# Patient Record
Sex: Female | Born: 1935 | Race: White | Hispanic: No | Smoking: Former smoker
Health system: Southern US, Community
[De-identification: ages and names within clinical notes are randomized; demographics above are authoritative.]

## PROBLEM LIST (undated history)

## (undated) DIAGNOSIS — J984 Other disorders of lung: Secondary | ICD-10-CM

## (undated) DIAGNOSIS — L258 Unspecified contact dermatitis due to other agents: Secondary | ICD-10-CM

## (undated) DIAGNOSIS — E119 Type 2 diabetes mellitus without complications: Secondary | ICD-10-CM

## (undated) DIAGNOSIS — J309 Allergic rhinitis, unspecified: Secondary | ICD-10-CM

## (undated) DIAGNOSIS — N259 Disorder resulting from impaired renal tubular function, unspecified: Secondary | ICD-10-CM

## (undated) DIAGNOSIS — N952 Postmenopausal atrophic vaginitis: Secondary | ICD-10-CM

## (undated) DIAGNOSIS — I872 Venous insufficiency (chronic) (peripheral): Secondary | ICD-10-CM

## (undated) DIAGNOSIS — I10 Essential (primary) hypertension: Secondary | ICD-10-CM

## (undated) DIAGNOSIS — N390 Urinary tract infection, site not specified: Secondary | ICD-10-CM

## (undated) DIAGNOSIS — R609 Edema, unspecified: Secondary | ICD-10-CM

## (undated) DIAGNOSIS — E785 Hyperlipidemia, unspecified: Secondary | ICD-10-CM

## (undated) DIAGNOSIS — B029 Zoster without complications: Secondary | ICD-10-CM

## (undated) DIAGNOSIS — N63 Unspecified lump in unspecified breast: Secondary | ICD-10-CM

## (undated) HISTORY — DX: Hyperlipidemia, unspecified: E78.5

## (undated) HISTORY — DX: Postmenopausal atrophic vaginitis: N95.2

## (undated) HISTORY — DX: Zoster without complications: B02.9

## (undated) HISTORY — DX: Essential (primary) hypertension: I10

## (undated) HISTORY — DX: Edema, unspecified: R60.9

## (undated) HISTORY — DX: Other disorders of lung: J98.4

## (undated) HISTORY — DX: Type 2 diabetes mellitus without complications: E11.9

## (undated) HISTORY — PX: EYE SURGERY: SHX253

## (undated) HISTORY — DX: Allergic rhinitis, unspecified: J30.9

## (undated) HISTORY — DX: Urinary tract infection, site not specified: N39.0

## (undated) HISTORY — DX: Unspecified contact dermatitis due to other agents: L25.8

## (undated) HISTORY — PX: TOOTH EXTRACTION: SUR596

## (undated) HISTORY — DX: Venous insufficiency (chronic) (peripheral): I87.2

## (undated) HISTORY — DX: Disorder resulting from impaired renal tubular function, unspecified: N25.9

## (undated) HISTORY — PX: HEMORRHOID SURGERY: SHX153

## (undated) HISTORY — DX: Unspecified lump in unspecified breast: N63.0

---

## 1999-12-27 ENCOUNTER — Encounter: Admission: RE | Admit: 1999-12-27 | Discharge: 1999-12-27 | Payer: Self-pay | Admitting: Family Medicine

## 1999-12-27 ENCOUNTER — Encounter: Payer: Self-pay | Admitting: Family Medicine

## 2000-12-04 ENCOUNTER — Other Ambulatory Visit: Admission: RE | Admit: 2000-12-04 | Discharge: 2000-12-04 | Payer: Self-pay | Admitting: Family Medicine

## 2000-12-27 ENCOUNTER — Encounter: Payer: Self-pay | Admitting: Family Medicine

## 2000-12-27 ENCOUNTER — Encounter: Admission: RE | Admit: 2000-12-27 | Discharge: 2000-12-27 | Payer: Self-pay | Admitting: Family Medicine

## 2002-01-10 ENCOUNTER — Encounter: Admission: RE | Admit: 2002-01-10 | Discharge: 2002-01-10 | Payer: Self-pay | Admitting: Family Medicine

## 2002-01-10 ENCOUNTER — Encounter: Payer: Self-pay | Admitting: Family Medicine

## 2002-02-20 ENCOUNTER — Other Ambulatory Visit: Admission: RE | Admit: 2002-02-20 | Discharge: 2002-02-20 | Payer: Self-pay | Admitting: Family Medicine

## 2003-02-12 ENCOUNTER — Encounter: Payer: Self-pay | Admitting: Family Medicine

## 2003-02-12 ENCOUNTER — Encounter: Admission: RE | Admit: 2003-02-12 | Discharge: 2003-02-12 | Payer: Self-pay | Admitting: Family Medicine

## 2003-02-19 ENCOUNTER — Encounter: Admission: RE | Admit: 2003-02-19 | Discharge: 2003-02-19 | Payer: Self-pay | Admitting: Family Medicine

## 2003-02-19 ENCOUNTER — Encounter: Payer: Self-pay | Admitting: Family Medicine

## 2003-03-27 ENCOUNTER — Other Ambulatory Visit: Admission: RE | Admit: 2003-03-27 | Discharge: 2003-03-27 | Payer: Self-pay | Admitting: Family Medicine

## 2003-04-15 ENCOUNTER — Encounter: Admission: RE | Admit: 2003-04-15 | Discharge: 2003-04-15 | Payer: Self-pay | Admitting: Family Medicine

## 2003-04-15 ENCOUNTER — Encounter: Payer: Self-pay | Admitting: Family Medicine

## 2004-03-24 ENCOUNTER — Other Ambulatory Visit: Admission: RE | Admit: 2004-03-24 | Discharge: 2004-03-24 | Payer: Self-pay | Admitting: Family Medicine

## 2004-03-25 ENCOUNTER — Encounter: Admission: RE | Admit: 2004-03-25 | Discharge: 2004-03-25 | Payer: Self-pay | Admitting: Family Medicine

## 2004-04-12 ENCOUNTER — Encounter: Admission: RE | Admit: 2004-04-12 | Discharge: 2004-04-12 | Payer: Self-pay | Admitting: Family Medicine

## 2005-04-03 ENCOUNTER — Other Ambulatory Visit: Admission: RE | Admit: 2005-04-03 | Discharge: 2005-04-03 | Payer: Self-pay | Admitting: Family Medicine

## 2005-04-03 ENCOUNTER — Ambulatory Visit: Payer: Self-pay | Admitting: Family Medicine

## 2006-04-20 ENCOUNTER — Encounter: Admission: RE | Admit: 2006-04-20 | Discharge: 2006-04-20 | Payer: Self-pay | Admitting: Family Medicine

## 2006-06-15 ENCOUNTER — Ambulatory Visit: Payer: Self-pay | Admitting: Family Medicine

## 2006-06-15 ENCOUNTER — Encounter: Admission: RE | Admit: 2006-06-15 | Discharge: 2006-06-15 | Payer: Self-pay | Admitting: Family Medicine

## 2006-06-15 ENCOUNTER — Other Ambulatory Visit: Admission: RE | Admit: 2006-06-15 | Discharge: 2006-06-15 | Payer: Self-pay | Admitting: Family Medicine

## 2006-06-15 ENCOUNTER — Encounter: Payer: Self-pay | Admitting: Family Medicine

## 2006-06-18 ENCOUNTER — Ambulatory Visit: Payer: Self-pay | Admitting: Internal Medicine

## 2007-04-22 ENCOUNTER — Encounter: Admission: RE | Admit: 2007-04-22 | Discharge: 2007-04-22 | Payer: Self-pay | Admitting: Family Medicine

## 2007-06-05 DIAGNOSIS — N259 Disorder resulting from impaired renal tubular function, unspecified: Secondary | ICD-10-CM

## 2007-06-05 DIAGNOSIS — E785 Hyperlipidemia, unspecified: Secondary | ICD-10-CM | POA: Insufficient documentation

## 2007-06-05 DIAGNOSIS — J309 Allergic rhinitis, unspecified: Secondary | ICD-10-CM | POA: Insufficient documentation

## 2007-06-05 HISTORY — DX: Allergic rhinitis, unspecified: J30.9

## 2007-06-05 HISTORY — DX: Hyperlipidemia, unspecified: E78.5

## 2007-06-05 HISTORY — DX: Disorder resulting from impaired renal tubular function, unspecified: N25.9

## 2007-06-26 ENCOUNTER — Other Ambulatory Visit: Admission: RE | Admit: 2007-06-26 | Discharge: 2007-06-26 | Payer: Self-pay | Admitting: Family Medicine

## 2007-06-26 ENCOUNTER — Encounter: Payer: Self-pay | Admitting: Family Medicine

## 2007-06-26 ENCOUNTER — Ambulatory Visit: Payer: Self-pay | Admitting: Family Medicine

## 2007-06-26 DIAGNOSIS — R209 Unspecified disturbances of skin sensation: Secondary | ICD-10-CM | POA: Insufficient documentation

## 2007-06-26 DIAGNOSIS — N952 Postmenopausal atrophic vaginitis: Secondary | ICD-10-CM | POA: Insufficient documentation

## 2007-06-26 HISTORY — DX: Postmenopausal atrophic vaginitis: N95.2

## 2007-07-22 ENCOUNTER — Ambulatory Visit: Payer: Self-pay | Admitting: Gastroenterology

## 2007-07-29 ENCOUNTER — Ambulatory Visit: Payer: Self-pay | Admitting: Gastroenterology

## 2007-07-29 ENCOUNTER — Encounter: Payer: Self-pay | Admitting: Family Medicine

## 2007-08-22 ENCOUNTER — Ambulatory Visit: Payer: Self-pay | Admitting: Family Medicine

## 2007-09-18 ENCOUNTER — Encounter: Payer: Self-pay | Admitting: Family Medicine

## 2008-05-13 ENCOUNTER — Encounter: Admission: RE | Admit: 2008-05-13 | Discharge: 2008-05-13 | Payer: Self-pay | Admitting: Family Medicine

## 2008-07-15 ENCOUNTER — Encounter: Payer: Self-pay | Admitting: Family Medicine

## 2008-07-16 ENCOUNTER — Ambulatory Visit: Payer: Self-pay | Admitting: Family Medicine

## 2008-07-16 ENCOUNTER — Encounter: Payer: Self-pay | Admitting: Family Medicine

## 2008-07-16 ENCOUNTER — Other Ambulatory Visit: Admission: RE | Admit: 2008-07-16 | Discharge: 2008-07-16 | Payer: Self-pay | Admitting: Family Medicine

## 2008-07-16 LAB — CONVERTED CEMR LAB
ALT: 20 units/L (ref 0–35)
AST: 16 units/L (ref 0–37)
Alkaline Phosphatase: 56 units/L (ref 39–117)
Chloride: 104 meq/L (ref 96–112)
Eosinophils Absolute: 0.2 10*3/uL (ref 0.0–0.7)
GFR calc non Af Amer: 66 mL/min
HDL: 44.9 mg/dL (ref >39.0)
Hemoglobin: 14.3 g/dL (ref 12.0–15.0)
Ketones, urine, test strip: NEGATIVE
MCHC: 34.6 g/dL (ref 30.0–36.0)
MCV: 92.2 fL (ref 78.0–100.0)
Monocytes Absolute: 0.7 10*3/uL (ref 0.1–1.0)
Neutro Abs: 3.8 10*3/uL (ref 1.4–7.7)
Nitrite: NEGATIVE
Potassium: 4.4 meq/L (ref 3.5–5.1)
Protein, U semiquant: NEGATIVE
RBC: 4.49 M/uL (ref 3.87–5.11)
Sodium: 142 meq/L (ref 135–145)
Total Bilirubin: 0.8 mg/dL (ref 0.3–1.2)

## 2008-09-03 ENCOUNTER — Ambulatory Visit: Payer: Self-pay | Admitting: Family Medicine

## 2008-09-03 DIAGNOSIS — N390 Urinary tract infection, site not specified: Secondary | ICD-10-CM

## 2008-09-03 HISTORY — DX: Urinary tract infection, site not specified: N39.0

## 2008-09-03 LAB — CONVERTED CEMR LAB
Nitrite: NEGATIVE
Protein, U semiquant: 30
Specific Gravity, Urine: <1.005
pH: 6.5

## 2009-07-15 ENCOUNTER — Encounter: Admission: RE | Admit: 2009-07-15 | Discharge: 2009-07-15 | Payer: Self-pay | Admitting: Family Medicine

## 2009-07-19 ENCOUNTER — Other Ambulatory Visit: Admission: RE | Admit: 2009-07-19 | Discharge: 2009-07-19 | Payer: Self-pay | Admitting: Family Medicine

## 2009-07-19 ENCOUNTER — Ambulatory Visit: Payer: Self-pay | Admitting: Family Medicine

## 2009-07-19 ENCOUNTER — Encounter: Payer: Self-pay | Admitting: Family Medicine

## 2009-07-19 DIAGNOSIS — R609 Edema, unspecified: Secondary | ICD-10-CM

## 2009-07-19 HISTORY — DX: Edema, unspecified: R60.9

## 2009-07-26 ENCOUNTER — Telehealth: Payer: Self-pay | Admitting: Family Medicine

## 2009-07-26 DIAGNOSIS — J984 Other disorders of lung: Secondary | ICD-10-CM

## 2009-07-26 HISTORY — DX: Other disorders of lung: J98.4

## 2009-07-30 ENCOUNTER — Ambulatory Visit: Payer: Self-pay | Admitting: Internal Medicine

## 2009-11-28 DIAGNOSIS — B029 Zoster without complications: Secondary | ICD-10-CM

## 2009-11-28 HISTORY — DX: Zoster without complications: B02.9

## 2009-12-01 ENCOUNTER — Ambulatory Visit: Payer: Self-pay | Admitting: Family Medicine

## 2009-12-01 ENCOUNTER — Telehealth: Payer: Self-pay | Admitting: Family Medicine

## 2009-12-09 ENCOUNTER — Ambulatory Visit: Payer: Self-pay | Admitting: Family Medicine

## 2010-01-31 ENCOUNTER — Ambulatory Visit: Payer: Self-pay | Admitting: Family Medicine

## 2010-02-02 ENCOUNTER — Ambulatory Visit: Payer: Self-pay | Admitting: Family Medicine

## 2010-02-02 DIAGNOSIS — L258 Unspecified contact dermatitis due to other agents: Secondary | ICD-10-CM

## 2010-02-02 HISTORY — DX: Unspecified contact dermatitis due to other agents: L25.8

## 2010-02-07 ENCOUNTER — Telehealth: Payer: Self-pay | Admitting: Family Medicine

## 2010-02-14 ENCOUNTER — Ambulatory Visit: Payer: Self-pay | Admitting: Family Medicine

## 2010-05-19 ENCOUNTER — Ambulatory Visit: Payer: Self-pay | Admitting: Family Medicine

## 2010-05-19 ENCOUNTER — Encounter: Admission: RE | Admit: 2010-05-19 | Discharge: 2010-05-19 | Payer: Self-pay | Admitting: Family Medicine

## 2010-05-19 DIAGNOSIS — N63 Unspecified lump in unspecified breast: Secondary | ICD-10-CM

## 2010-05-19 HISTORY — DX: Unspecified lump in unspecified breast: N63.0

## 2010-08-01 ENCOUNTER — Encounter: Payer: Self-pay | Admitting: Family Medicine

## 2010-08-01 ENCOUNTER — Other Ambulatory Visit: Admission: RE | Admit: 2010-08-01 | Discharge: 2010-08-01 | Payer: Self-pay | Admitting: Family Medicine

## 2010-08-01 ENCOUNTER — Ambulatory Visit: Payer: Self-pay | Admitting: Family Medicine

## 2010-08-01 LAB — CONVERTED CEMR LAB
AST: 20 units/L (ref 0–37)
Basophils Relative: 0.7 % (ref 0.0–3.0)
Bilirubin, Direct: 0.1 mg/dL (ref 0.0–0.3)
CO2: 29 meq/L (ref 19–32)
Calcium: 9.2 mg/dL (ref 8.4–10.5)
Creatinine, Ser: 0.9 mg/dL (ref 0.4–1.2)
Glucose, Bld: 124 mg/dL — ABNORMAL HIGH (ref 70–99)
HCT: 41.4 % (ref 36.0–46.0)
Lymphocytes Relative: 29.7 % (ref 12.0–46.0)
Lymphs Abs: 2.3 10*3/uL (ref 0.7–4.0)
MCHC: 34 g/dL (ref 30.0–36.0)
Monocytes Relative: 9.2 % (ref 3.0–12.0)
Neutro Abs: 4.4 10*3/uL (ref 1.4–7.7)
Neutrophils Relative %: 57.1 % (ref 43.0–77.0)
Nitrite: NEGATIVE
Pap Smear: NEGATIVE
Potassium: 4.7 meq/L (ref 3.5–5.1)
RBC: 4.49 M/uL (ref 3.87–5.11)
RDW: 12.8 % (ref 11.5–14.6)
Specific Gravity, Urine: 1.025
TSH: 1 microintl units/mL (ref 0.35–5.50)
Urobilinogen, UA: 0.2

## 2010-08-17 ENCOUNTER — Encounter: Admission: RE | Admit: 2010-08-17 | Discharge: 2010-08-17 | Payer: Self-pay | Admitting: Family Medicine

## 2010-11-27 LAB — CONVERTED CEMR LAB
ALT: 23 units/L (ref 0–35)
ALT: 25 units/L (ref 0–35)
AST: 19 units/L (ref 0–37)
AST: 19 units/L (ref 0–37)
Albumin: 3.9 g/dL (ref 3.5–5.2)
Alkaline Phosphatase: 56 units/L (ref 39–117)
Alkaline Phosphatase: 59 units/L (ref 39–117)
BUN: 12 mg/dL (ref 6–23)
BUN: 8 mg/dL (ref 6–23)
Basophils Absolute: 0 10*3/uL (ref 0.0–0.1)
Basophils Relative: 0.1 % (ref 0.0–1.0)
Bilirubin, Direct: 0 mg/dL (ref 0.0–0.3)
CO2: 32 meq/L (ref 19–32)
Cholesterol: 180 mg/dL (ref 0–200)
Eosinophils Absolute: 0.2 10*3/uL (ref 0.0–0.7)
GFR calc Af Amer: 80 mL/min
HCT: 44 % (ref 36.0–46.0)
HDL: 45.9 mg/dL (ref >39.0)
Hemoglobin: 14.6 g/dL (ref 12.0–15.0)
LDL Cholesterol: 117 mg/dL — ABNORMAL HIGH (ref 0–99)
MCHC: 33.9 g/dL (ref 30.0–36.0)
MCHC: 34.7 g/dL (ref 30.0–36.0)
MCV: 91.1 fL (ref 78.0–100.0)
Monocytes Absolute: 0.8 10*3/uL (ref 0.1–1.0)
Neutro Abs: 3.7 10*3/uL (ref 1.4–7.7)
Neutrophils Relative %: 50.8 % (ref 43.0–77.0)
Neutrophils Relative %: 52.8 % (ref 43.0–77.0)
Nitrite: NEGATIVE
Platelets: 225 10*3/uL (ref 150.0–400.0)
Potassium: 4 meq/L (ref 3.5–5.1)
Potassium: 4.3 meq/L (ref 3.5–5.1)
RBC: 4.62 M/uL (ref 3.87–5.11)
RDW: 12.4 % (ref 11.5–14.6)
RDW: 12.5 % (ref 11.5–14.6)
Sodium: 144 meq/L (ref 135–145)
Sodium: 144 meq/L (ref 135–145)
Total Bilirubin: 0.8 mg/dL (ref 0.3–1.2)
Total Protein: 6.6 g/dL (ref 6.0–8.3)
VLDL: 15.6 mg/dL (ref 0.0–40.0)
VLDL: 17 mg/dL (ref 0–40)
WBC: 7.2 10*3/uL (ref 4.5–10.5)
pH: 5.5

## 2010-11-29 NOTE — Assessment & Plan Note (Signed)
Summary: right breast pain/njr   Vital Signs:  Patient profile:   77 year old female Menstrual status:  postmenopausal Weight:      164 pounds Temp:     97.9 degrees F oral BP sitting:   130 / 80  (left arm) Cuff size:   regular  Vitals Entered By: Kathrynn Speed CMA (May 19, 2010 11:07 AM) CC: Right breast pain, x one week, src   CC:  Right breast pain, x one week, and src.  History of Present Illness: Kayla Alvarez is a 77 year old female, who comes in today with soreness in her right breast x 1 week.  About a week ago she noticed some soreness in her right breast pain.  She points to the 12 o'clock position about 2 inches above her nipple.  No history of trauma.  No discharges Septra.  Mammogram September 2010 normal  Current Medications (verified): 1)  Lasix 20 Mg  Tabs (Furosemide) .... Take 1 Tablet By Mouth Once A Day 2)  Zocor 20 Mg  Tabs (Simvastatin) .... Take 1 Tablet By Mouth Once A Day 3)  Mvi 4)  Fish Oil  Allergies (verified): No Known Drug Allergies  Past History:  Past medical, surgical, family and social histories (including risk factors) reviewed, and no changes noted (except as noted below).  Past Medical History: Reviewed history from 06/05/2007 and no changes required. Allergic rhinitis Hyperlipidemia Venous insufficiency Glaucoma  Past Surgical History: Reviewed history from 06/05/2007 and no changes required. Hemorrhoidectomy Childbirth x 1  Family History: Reviewed history from 06/05/2007 and no changes required. Family History Hypertension Family History of Stroke F 1st degree relative <60 Fam hx MI Family History Other cancer-Breast  Social History: Reviewed history from 06/26/2007 and no changes required. Former Smoker Alcohol use-no Married Regular exercise-no  Review of Systems      See HPI  Physical Exam  General:  Well-developed,well-nourished,in no acute distress; alert,appropriate and cooperative throughout  examination Breasts:  left breast normal right breast.  There is a soft, tender movable lesion 2 inches above the nipple at the 12 clock position.   Problems:  Medical Problems Added: 1)  Dx of Breast Lump  (ICD-611.72)  Impression & Recommendations:  Problem # 1:  BREAST LUMP (ICD-611.72) Assessment New  Orders: Radiology Referral (Radiology)  Complete Medication List: 1)  Lasix 20 Mg Tabs (Furosemide) .... Take 1 tablet by mouth once a day 2)  Zocor 20 Mg Tabs (Simvastatin) .... Take 1 tablet by mouth once a day 3)  Mvi  4)  Fish Oil   Patient Instructions: 1)  we will get to set up for a diagnostic ultrasound ASAP

## 2010-11-29 NOTE — Assessment & Plan Note (Signed)
Summary: shingles in eyes?dm   Vital Signs:  Patient profile:   77 year old female Menstrual status:  postmenopausal BP sitting:   130 / 90  (left arm) Cuff size:   regular  Vitals Entered By: Kern Reap CMA Duncan Dull) (February 02, 2010 10:16 AM) CC: right eye, right side of face red and swollen   CC:  right eye and right side of face red and swollen.  History of Present Illness: Kayla Alvarez is a 77 year old female, married, nonsmoker comes in today for evaluation of itching and swelling around her right eye x 1 day.  We saw her on Monday with a vesicular.  It is slightly painful lesion on her left hip.  We diagnosed her to have shingles and put her on Zovirax 800 mg t.i.d.  That seems to be improving.  Yesterday he should she notice some itching around her right upper eyelid.  She then noticed some redness and swelling.  Late last night.  She does not recall anything.  She came in contact with the neck causes problems  Allergies: No Known Drug Allergies  Past History:  Past medical, surgical, family and social histories (including risk factors) reviewed for relevance to current acute and chronic problems.  Past Medical History: Reviewed history from 06/05/2007 and no changes required. Allergic rhinitis Hyperlipidemia Venous insufficiency Glaucoma  Past Surgical History: Reviewed history from 06/05/2007 and no changes required. Hemorrhoidectomy Childbirth x 1  Family History: Reviewed history from 06/05/2007 and no changes required. Family History Hypertension Family History of Stroke F 1st degree relative <60 Fam hx MI Family History Other cancer-Breast  Social History: Reviewed history from 06/26/2007 and no changes required. Former Smoker Alcohol use-no Married Regular exercise-no  Review of Systems      See HPI  Physical Exam  General:  Well-developed,well-nourished,in no acute distress; alert,appropriate and cooperative throughout examination Head:   Normocephalic and atraumatic without obvious abnormalities. No apparent alopecia or balding. Eyes:  No corneal or conjunctival inflammation noted. EOMI. Perrla. Funduscopic exam benign, without hemorrhages, exudates or papilledema. Vision grossly normal...normal vision Ears:  External ear exam shows no significant lesions or deformities.  Otoscopic examination reveals clear canals, tympanic membranes are intact bilaterally without bulging, retraction, inflammation or discharge. Hearing is grossly normal bilaterally. Nose:  External nasal examination shows no deformity or inflammation. Nasal mucosa are pink and moist without lesions or exudates. Mouth:  Oral mucosa and oropharynx without lesions or exudates.  Teeth in good repair. Skin:  there is some erythema around the lower lid and the upper lid, consistent with a contact dermatitis   Impression & Recommendations:  Problem # 1:  CONTACT DERMATITIS&OTH ECZEMA DUE OTH SPEC AGENT (ZOX-096.04) Assessment New  Her updated medication list for this problem includes:    Prednisone 20 Mg Tabs (Prednisone) ..... Uad  Orders: Prescription Created Electronically [phone])  Complete Medication List: 1)  Lasix 20 Mg Tabs (Furosemide) .... Take 1 tablet by mouth once a day 2)  Zocor 20 Mg Tabs (Simvastatin) .... Take 1 tablet by mouth once a day 3)  Mvi  4)  Fish Oil  5)  Prednisone 20 Mg Tabs (Prednisone) .... Uad 6)  Zovirax 400 Mg Tabs (Acyclovir) .... 2 by mouth three times a day 7)  Vicodin Es 7.5-750 Mg Tabs (Hydrocodone-acetaminophen) .... Take 1 tablet by mouth four times a day as needed pain  Patient Instructions: 1)  begin prednisone, take two tabs x 3 days, one x 3 days, a half x 3 days,  then half a tablet Monday, Wednesday, Friday, for two week taper.  Return p.r.n.Marland Kitchen Prescriptions: PREDNISONE 20 MG TABS (PREDNISONE) UAD  #40 x 1   Entered and Authorized by:   Roderick Pee MD   Signed by:   Roderick Pee MD on 02/02/2010   Method  used:   Electronically to        [facility] (retail)       [address]       [place], [place]  [address]       Ph: [phone]       Fax: [phone]   RxID:   [id number]

## 2010-11-29 NOTE — Assessment & Plan Note (Signed)
Summary: increased redness from poison ivy and shingles/dm   Vital Signs:  Patient profile:   77 year old female Menstrual status:  postmenopausal Temp:     98.5 degrees F oral BP sitting:   140 / 84  (left arm) Cuff size:   regular  Vitals Entered By: Kern Reap CMA Duncan Dull) (February 14, 2010 4:14 PM) CC: increased redness, rash   CC:  increased redness and rash.  History of Present Illness:  Shaily is a 77 year old female, who comes in today for reevaluation of a contact dermatitis and shingles.  She's been on Zovirax 800 mg 3 times a day because of the shingles on her left hip.  That has resolved however, the contact dermatitis is gotten worse.  We start her on some prednisone on April the sixth however, the rash has spread to her arms and neck.  Allergies: No Known Drug Allergies  Past History:  Past medical, surgical, family and social histories (including risk factors) reviewed for relevance to current acute and chronic problems.  Past Medical History: Reviewed history from 06/05/2007 and no changes required. Allergic rhinitis Hyperlipidemia Venous insufficiency Glaucoma  Past Surgical History: Reviewed history from 06/05/2007 and no changes required. Hemorrhoidectomy Childbirth x 1  Family History: Reviewed history from 06/05/2007 and no changes required. Family History Hypertension Family History of Stroke F 1st degree relative <60 Fam hx MI Family History Other cancer-Breast  Social History: Reviewed history from 06/26/2007 and no changes required. Former Smoker Alcohol use-no Married Regular exercise-no  Review of Systems      See HPI  Physical Exam  General:  Well-developed,well-nourished,in no acute distress; alert,appropriate and cooperative throughout examination Skin:  extensive contact dermatitis, arms, and chest   Impression & Recommendations:  Problem # 1:  CONTACT DERMATITIS&OTH ECZEMA DUE OTH SPEC AGENT (ICD-692.89) Assessment  Deteriorated  Her updated medication list for this problem includes:    Prednisone 20 Mg Tabs (Prednisone) ..... Uad  Complete Medication List: 1)  Lasix 20 Mg Tabs (Furosemide) .... Take 1 tablet by mouth once a day 2)  Zocor 20 Mg Tabs (Simvastatin) .... Take 1 tablet by mouth once a day 3)  Mvi  4)  Fish Oil  5)  Prednisone 20 Mg Tabs (Prednisone) .... Uad 6)  Zovirax 400 Mg Tabs (Acyclovir) .... 2 by mouth three times a day 7)  Vicodin Es 7.5-750 Mg Tabs (Hydrocodone-acetaminophen) .... Take 1 tablet by mouth four times a day as needed pain  Patient Instructions: 1)  take 3 prednisone tablets tonight.  Starting tomorrow two tabs every morning until the rash is significantly improved and then begin to taper by taking one tablet for 3 days, after 3 days, then half a tablet Monday, Wednesday, Friday, for a two week taper

## 2010-11-29 NOTE — Progress Notes (Signed)
Summary: shingles  Phone Note Call from Patient   Caller: Patient Call For: Roderick Pee MD Summary of Call: Pt decided the rash on her face was poison ivy, but her shingles around her waist is extremely painful and needs RX for pain. [phone] Initial call taken by: Lynann Beaver CMA,  February 07, 2010 9:37 AM  Follow-up for Phone Call        Vicodin ES, dispense 30 tablets, directions one half to one tablet t.i.d., p.r.n. severe pain, refills x 2 Follow-up by: Roderick Pee MD,  February 07, 2010 11:15 AM  Additional Follow-up for Phone Call Additional follow up Details #1::        Pt. notified. Additional Follow-up by: Lynann Beaver CMA,  February 07, 2010 11:22 AM

## 2010-11-29 NOTE — Assessment & Plan Note (Signed)
Summary: 1 WK ROV // RS   Vital Signs:  Patient profile:   77 year old female Menstrual status:  postmenopausal Weight:      165 pounds Temp:     98.7 degrees F oral BP sitting:   120 / 78  (left arm) Cuff size:   regular  Vitals Entered By: Kern Reap CMA Duncan Dull) (December 09, 2009 10:30 AM)  Reason for Visit 1 week follow up   History of Present Illness: Aadhya is a 77 year old, married female, nonsmoker.  The comes in today for evaluation of two problems.  We saw her 6 days ago with a cough related to a viral syndrome.  That triggered some asthma and shingles on her right arm.  For the asthma.  We start her on prednisone two tabs x 3 days with a taper.  She is down to half a tablet a day and feels 75% better.  No side effects from medication.  For the shingles.  She is been on Zovirax 800 mg 3 times a day.  The rash is almost gone.  No pain  Allergies: No Known Drug Allergies  Review of Systems      See HPI  Physical Exam  General:  Well-developed,well-nourished,in no acute distress; alert,appropriate and cooperative throughout examination Lungs:  Normal respiratory effort, chest expands symmetrically. Lungs are clear to auscultation, no crackles or wheezes. Skin:  healing shingles rash, right arm   Impression & Recommendations:  Problem # 1:  COUGH (ICD-786.2) Assessment Improved  Orders: T-2 View CXR (71020TC)  Problem # 2:  HERPES ZOSTER (ICD-053.9) Assessment: Improved  Complete Medication List: 1)  Lasix 20 Mg Tabs (Furosemide) .... Take 1 tablet by mouth once a day 2)  Zocor 20 Mg Tabs (Simvastatin) .... Take 1 tablet by mouth once a day 3)  Mvi  4)  Fish Oil  5)  Prednisone 20 Mg Tabs (Prednisone) .... Uad 6)  Zovirax 400 Mg Tabs (Acyclovir) .... 2 by mouth three times a day 7)  Vicodin Es 7.5-750 Mg Tabs (Hydrocodone-acetaminophen) .... Take 1 tablet by mouth four times a day as needed pain  Patient Instructions: 1)  take one half prednisone  tablet today and tomorrow skip the weekend, then one half tablet Monday, Wednesday, Friday, for two weeks. 2)  Take the Zovirax, two tabs 3 times a day for one more week, then stop.  Return p.r.n.

## 2010-11-29 NOTE — Assessment & Plan Note (Signed)
Summary: SHINGLES? // RS   Vital Signs:  Patient profile:   77 year old female Menstrual status:  postmenopausal Weight:      165 pounds Temp:     98.4 degrees F oral BP sitting:   110 / 80  (left arm) Cuff size:   regular  Vitals Entered By: Kern Reap CMA Duncan Dull) (January 31, 2010 11:04 AM) CC: shingles Is Patient Diabetic? No   CC:  shingles.  History of Present Illness: Kayla Alvarez is a 77 year old, married female, who comes in today for evaluation of a rash on her left hip.  About a week ago she noticed some itching.  A couple days later, she noticed a rash.  She has had a history of shingles in the past and was on Zovirax in February.  That cleared up nicely with no residual pain  Allergies: No Known Drug Allergies  Past History:  Past medical, surgical, family and social histories (including risk factors) reviewed for relevance to current acute and chronic problems.  Past Medical History: Reviewed history from 06/05/2007 and no changes required. Allergic rhinitis Hyperlipidemia Venous insufficiency Glaucoma  Past Surgical History: Reviewed history from 06/05/2007 and no changes required. Hemorrhoidectomy Childbirth x 1  Family History: Reviewed history from 06/05/2007 and no changes required. Family History Hypertension Family History of Stroke F 1st degree relative <60 Fam hx MI Family History Other cancer-Breast  Social History: Reviewed history from 06/26/2007 and no changes required. Former Smoker Alcohol use-no Married Regular exercise-no  Review of Systems      See HPI  Physical Exam  General:  Well-developed,well-nourished,in no acute distress; alert,appropriate and cooperative throughout examination Skin:  there is a red, 1 inch x 1 inch area on her left hip.  This raised, red, and vesicular   Impression & Recommendations:  Problem # 1:  HERPES ZOSTER (ICD-053.9) Assessment Deteriorated  Complete Medication List: 1)  Lasix 20 Mg Tabs  (Furosemide) .... Take 1 tablet by mouth once a day 2)  Zocor 20 Mg Tabs (Simvastatin) .... Take 1 tablet by mouth once a day 3)  Mvi  4)  Fish Oil  5)  Prednisone 20 Mg Tabs (Prednisone) .... Uad 6)  Zovirax 400 Mg Tabs (Acyclovir) .... 2 by mouth three times a day 7)  Vicodin Es 7.5-750 Mg Tabs (Hydrocodone-acetaminophen) .... Take 1 tablet by mouth four times a day as needed pain  Patient Instructions: 1)  restart the Zovirax return p.r.n.

## 2010-11-29 NOTE — Assessment & Plan Note (Signed)
Summary: COUGH, URI? // RS   Vital Signs:  Patient profile:   77 year old female Menstrual status:  postmenopausal Weight:      165 pounds Temp:     99.3 degrees F oral BP sitting:   140 / 78  (left arm) Cuff size:   regular  Vitals Entered By: Kern Reap CMA Duncan Dull) (December 01, 2009 11:28 AM)  Reason for Visit cough, pain with right arm  History of Present Illness: Kayla Alvarez is a 77 year old, married female, nonsmoker, who comes in today for evaluation of two problems.  Six weeks ago, she developed a cold.  The cold symptoms went away, but the cough has persisted.  She has no fever, shortness of breath, or wheezing.  She does have a history of allergic rhinitis, but usually in the spring.  She's also had pain in her right shoulder for 4 days.  Yesterday he noticed a red blistery rash.  Allergies: No Known Drug Allergies  Past History:  Past medical, surgical, family and social histories (including risk factors) reviewed for relevance to current acute and chronic problems.  Past Medical History: Reviewed history from 06/05/2007 and no changes required. Allergic rhinitis Hyperlipidemia Venous insufficiency Glaucoma  Past Surgical History: Reviewed history from 06/05/2007 and no changes required. Hemorrhoidectomy Childbirth x 1  Family History: Reviewed history from 06/05/2007 and no changes required. Family History Hypertension Family History of Stroke F 1st degree relative <60 Fam hx MI Family History Other cancer-Breast  Social History: Reviewed history from 06/26/2007 and no changes required. Former Smoker Alcohol use-no Married Regular exercise-no  Review of Systems      See HPI  Physical Exam  General:  Well-developed,well-nourished,in no acute distress; alert,appropriate and cooperative throughout examination Head:  Normocephalic and atraumatic without obvious abnormalities. No apparent alopecia or balding. Eyes:  No corneal or conjunctival  inflammation noted. EOMI. Perrla. Funduscopic exam benign, without hemorrhages, exudates or papilledema. Vision grossly normal. Ears:  External ear exam shows no significant lesions or deformities.  Otoscopic examination reveals clear canals, tympanic membranes are intact bilaterally without bulging, retraction, inflammation or discharge. Hearing is grossly normal bilaterally. Nose:  External nasal examination shows no deformity or inflammation. Nasal mucosa are pink and moist without lesions or exudates. Mouth:  Oral mucosa and oropharynx without lesions or exudates.  Teeth in good repair. Neck:  No deformities, masses, or tenderness noted. Chest Wall:  No deformities, masses, or tenderness noted. Lungs:  Normal respiratory effort, chest expands symmetrically. Lungs are clear to auscultation, no crackles or wheezes. Skin:  vesicular rash, right triceps area, consistent with herpes zoster   Impression & Recommendations:  Problem # 1:  COUGH (ICD-786.2) Assessment New  Orders: Prescription Created Electronically [id number])  Problem # 2:  HERPES ZOSTER (ICD-053.9) Assessment: New  Orders: Prescription Created Electronically [phone])  Complete Medication List: 1)  Lasix 20 Mg Tabs (Furosemide) .... Take 1 tablet by mouth once a day 2)  Zocor 20 Mg Tabs (Simvastatin) .... Take 1 tablet by mouth once a day 3)  Mvi  4)  Fish Oil  5)  Prednisone 20 Mg Tabs (Prednisone) .... Uad 6)  Zovirax 400 Mg Tabs (Acyclovir) .... 2 by mouth three times a day 7)  Vicodin Es 7.5-750 Mg Tabs (Hydrocodone-acetaminophen) .... Take 1 tablet by mouth four times a day as needed pain  Patient Instructions: 1)  begin Zovirax 400 mg two tablets 3 times a day for the shingles. 2)  You may also take a Vicodin one half  to one tablet every 4 to 6 hours as needed for severe pain. 3)  Also begin prednisone two tabs x 3 days, one x 3 days, a half x 3 days, then half a tablet Monday, Wednesday, Friday, for a two week  taper.  Return in one week for follow-up Prescriptions: VICODIN ES 7.5-750 MG TABS (HYDROCODONE-ACETAMINOPHEN) Take 1 tablet by mouth four times a day as needed pain  #50 x 1   Entered and Authorized by:   Roderick Pee MD   Signed by:   Roderick Pee MD on 12/01/2009   Method used:   Print then Give to Patient   RxID:   [id number] ZOVIRAX 400 MG TABS (ACYCLOVIR) 2 by mouth three times a day  #100 x 1   Entered and Authorized by:   Roderick Pee MD   Signed by:   Roderick Pee MD on 12/01/2009   Method used:   Electronically to        [facility] (retail)       [address]       [place], [place]  [address]       Ph: [phone]       Fax: [phone]   RxID:   [id number] PREDNISONE 20 MG TABS (PREDNISONE) UAD  #40 x 0   Entered and Authorized by:   Roderick Pee MD   Signed by:   Roderick Pee MD on 12/01/2009   Method used:   Electronically to        [facility] (retail)       [address]       [place], [place]  [address]       Ph: [phone]       Fax: [phone]   RxID:   [id number]

## 2010-11-29 NOTE — Assessment & Plan Note (Signed)
Summary: emp/pap/pt coming in fasting/cjr   Vital Signs:  Patient profile:   77 year old female Menstrual status:  postmenopausal Height:      60.25 inches Weight:      170 pounds BMI:     33.04 Temp:     98.6 degrees F oral BP sitting:   124 / 74  (left arm) Cuff size:   regular  Vitals Entered By: Kern Reap CMA Duncan Dull) (August 01, 2010 10:49 AM) CC: annual wellness exam Is Patient Diabetic? No Pain Assessment Patient in pain? no        CC:  annual wellness exam.  History of Present Illness: Kayla Alvarez is a delightful, 77 year old, married female, nonsmoker, who comes in today for Medicare wellness exam.  She has a history of underlying venous insufficiency for which she takes Lasix 20 mg daily.  She also takes Zocor 20 mg nightly for hyperlipidemia.  Will check lipid panel today.  She declines taking an aspirin and declines a flu shot.  She does get routine eye care, dental care, BSE monthly ,,,,,,no...... and you mammography, colonoscopy, normal, tetanus, 2010, Pneumovax 2008, again declined shingles.  Vaccine and flu shots Here for Medicare AWV:  1.   Risk factors based on Past M, S, F history:... reviewed in detail.  No changes. 2.   Physical Activities: Walks daily 3.   Depression/mood: good mood.  No depression 4.   Hearing: normal 5.   ADL's: reviewed able to function normally and independently, and do her own.  Mathematical calculations 6.   Fall Risk: reviewed not identified 7.   Home Safety: reviewed.  No guns in the house 8.   Height, weight, &visual acuity:height weight, normal.  Vision normal annual eye exam by Dr. Clayburn Pert.   Counseling: none indicated.  Except continue to take her medication walk daily 10.   Labs ordered based on risk factors: done today 11.           Referral Coordination........none indicated 12.           Care Plan...Marland KitchenMarland KitchenMarland Kitchenreviewed in detail.  Again, patient declines flu shot, and shingles  13.            Cognitive Assessment ...Marland KitchenMarland KitchenMarland Kitchenorient x 3 able  to do her own.  Mathematical calculations   Allergies: No Known Drug Allergies  Past History:  Past medical, surgical, family and social histories (including risk factors) reviewed, and no changes noted (except as noted below).  Past Medical History: Reviewed history from 06/05/2007 and no changes required. Allergic rhinitis Hyperlipidemia Venous insufficiency Glaucoma  Past Surgical History: Reviewed history from 06/05/2007 and no changes required. Hemorrhoidectomy Childbirth x 1  Family History: Reviewed history from 06/05/2007 and no changes required. Family History Hypertension Family History of Stroke F 1st degree relative <60 Fam hx MI Family History Other cancer-Breast  Social History: Reviewed history from 06/26/2007 and no changes required. Former Smoker Alcohol use-no Married Regular exercise-no  Review of Systems      See HPI  Physical Exam  General:  Well-developed,well-nourished,in no acute distress; alert,appropriate and cooperative throughout examination Head:  Normocephalic and atraumatic without obvious abnormalities. No apparent alopecia or balding. Eyes:  No corneal or conjunctival inflammation noted. EOMI. Perrla. Funduscopic exam benign, without hemorrhages, exudates or papilledema. Vision grossly normal. Ears:  External ear exam shows no significant lesions or deformities.  Otoscopic examination reveals clear canals, tympanic membranes are intact bilaterally without bulging, retraction, inflammation or discharge. Hearing is grossly normal bilaterally. Nose:  External nasal  examination shows no deformity or inflammation. Nasal mucosa are pink and moist without lesions or exudates. Mouth:  Oral mucosa and oropharynx without lesions or exudates.  Teeth in good repair. Neck:  No deformities, masses, or tenderness noted. Chest Wall:  No deformities, masses, or tenderness noted. Breasts:  them all to full small, soft, movable nodules in the right breast  in the 12 to the 3 o'clock position.  Per previous mammography shows heterogeneous breast tissue.  No malignancy Lungs:  Normal respiratory effort, chest expands symmetrically. Lungs are clear to auscultation, no crackles or wheezes. Heart:  Normal rate and regular rhythm. S1 and S2 normal without gallop, murmur, click, rub or other extra sounds. Abdomen:  Bowel sounds positive,abdomen soft and non-tender without masses, organomegaly or hernias noted. Rectal:  No external abnormalities noted. Normal sphincter tone. No rectal masses or tenderness. Genitalia:  Pelvic Exam:        External: normal female genitalia without lesions or masses        Vagina: normal without lesions or masses        Cervix: normal without lesions or masses        Adnexa: normal bimanual exam without masses or fullness        Uterus: normal by palpation        Pap smear: performed Msk:  No deformity or scoliosis noted of thoracic or lumbar spine.   Pulses:  R and L carotid,radial,femoral,dorsalis pedis and posterior tibial pulses are full and equal bilaterally Extremities:  No clubbing, cyanosis, edema, or deformity noted with normal full range of motion of all joints.   Neurologic:  No cranial nerve deficits noted. Station and gait are normal. Plantar reflexes are down-going bilaterally. DTRs are symmetrical throughout. Sensory, motor and coordinative functions appear intact. Skin:  Intact without suspicious lesions or rashes Cervical Nodes:  No lymphadenopathy noted Axillary Nodes:  No palpable lymphadenopathy Inguinal Nodes:  No significant adenopathy Psych:  Cognition and judgment appear intact. Alert and cooperative with normal attention span and concentration. No apparent delusions, illusions, hallucinations   Impression & Recommendations:  Problem # 1:  EDEMA (ICD-782.3) Assessment Unchanged  Her updated medication list for this problem includes:    Lasix 20 Mg Tabs (Furosemide) .Marland Kitchen... Take 1 tablet by mouth  once a day  Orders: Venipuncture (91478) Prescription Created Electronically [phone]) Medicare -1st Annual Wellness Visit [phone]) Urinalysis-dipstick only (Medicare patient) [phone]) Specimen Handling (29528) TLB-Lipid Panel (80061-LIPID) TLB-BMP (Basic Metabolic Panel-BMET) (80048-METABOL) TLB-CBC Platelet - w/Differential (85025-CBCD) TLB-Hepatic/Liver Function Pnl (80076-HEPATIC) TLB-TSH (Thyroid Stimulating Hormone) (84443-TSH)  Problem # 2:  HEALTH SCREENING (ICD-V70.0) Assessment: Unchanged  Orders: Venipuncture (41324) Prescription Created Electronically [phone]) Medicare -1st Annual Wellness Visit [phone]) Urinalysis-dipstick only (Medicare patient) ([id number]) Specimen Handling (42595) EKG w/ Interpretation (93000) TLB-Lipid Panel (80061-LIPID) TLB-BMP (Basic Metabolic Panel-BMET) (80048-METABOL) TLB-CBC Platelet - w/Differential (85025-CBCD) TLB-Hepatic/Liver Function Pnl (80076-HEPATIC) TLB-TSH (Thyroid Stimulating Hormone) (84443-TSH)  Problem # 3:  HYPERLIPIDEMIA (ICD-272.4) Assessment: Improved  Her updated medication list for this problem includes:    Zocor 20 Mg Tabs (Simvastatin) .Marland Kitchen... Take 1 tablet by mouth once a day  Orders: Venipuncture (63875) Prescription Created Electronically [phone]) Medicare -1st Annual Wellness Visit [phone]) Urinalysis-dipstick only (Medicare patient) (41660YT) Specimen Handling (01601) EKG w/ Interpretation (93000) TLB-Lipid Panel (80061-LIPID) TLB-BMP (Basic Metabolic Panel-BMET) (80048-METABOL) TLB-CBC Platelet - w/Differential (85025-CBCD) TLB-Hepatic/Liver Function Pnl (80076-HEPATIC) TLB-TSH (Thyroid Stimulating Hormone) (84443-TSH)  Problem # 4:  RENAL INSUFFICIENCY (ICD-588.9) Assessment: Unchanged  Orders: Venipuncture (09323) Prescription Created Electronically [phone]) Medicare -1st Annual  Wellness Visit [phone]) Urinalysis-dipstick only (Medicare patient) [phone]) Specimen Handling (84696) TLB-Lipid Panel  (80061-LIPID) TLB-BMP (Basic Metabolic Panel-BMET) (80048-METABOL) TLB-CBC Platelet - w/Differential (85025-CBCD) TLB-Hepatic/Liver Function Pnl (80076-HEPATIC) TLB-TSH (Thyroid Stimulating Hormone) (84443-TSH)  Complete Medication List: 1)  Lasix 20 Mg Tabs (Furosemide) .... Take 1 tablet by mouth once a day 2)  Zocor 20 Mg Tabs (Simvastatin) .... Take 1 tablet by mouth once a day 3)  Mvi  4)  Fish Oil  5)  Eye Drops   Patient Instructions: 1)  Please schedule a follow-up appointment in 1 year. 2)  It is important that you exercise regularly at least 20 minutes 5 times a week. If you develop chest pain, have severe difficulty breathing, or feel very tired , stop exercising immediately and seek medical attention. 3)  Schedule a colonoscopy/sigmoidoscopy to help detect colon cancer. 4)  Take calcium +Vitamin D daily. 5)  Take an Aspirin every day. 6)  10 mg of plain Claritin q.a.m. x 6 weeks.  This should help her cough if it doesn't then I would consider reflux and we would then ask  for a GI consult Prescriptions: ZOCOR 20 MG  TABS (SIMVASTATIN) Take 1 tablet by mouth once a day  #100 Each x 4   Entered and Authorized by:   Roderick Pee MD   Signed by:   Roderick Pee MD on 08/01/2010   Method used:   Electronically to        [facility] (retail)       [address]       [place], [place]  [address]       Ph: [phone]       Fax: [phone]   RxID:   [id number] LASIX 20 MG  TABS (FUROSEMIDE) Take 1 tablet by mouth once a day  #100 Each x 4   Entered and Authorized by:   Roderick Pee MD   Signed by:   Roderick Pee MD on 08/01/2010   Method used:   Electronically to        [facility] (retail)       [address]       [place], [place]  [address]       Ph: [phone]       Fax: [phone]   RxID:   [id number]     Laboratory Results   Urine Tests    Routine Urinalysis   Color:  yellow Appearance: Clear Glucose: negative   (Normal Range: Negative) Bilirubin: negative   (Normal Range: Negative) Ketone: negative   (Normal Range: Negative) Spec. Gravity: 1.025   (Normal Range: 1.003-1.035) Blood: trace-lysed   (Normal Range: Negative) pH: 5.5   (Normal Range: 5.0-8.0) Protein: negative   (Normal Range: Negative) Urobilinogen: 0.2   (Normal Range: 0-1) Nitrite: negative   (Normal Range: Negative) Leukocyte Esterace: negative   (Normal Range: Negative)    Comments: Rita Ohara  August 01, 2010 1:51 PM

## 2010-11-29 NOTE — Progress Notes (Signed)
Summary: cough syrup - fyi  Phone Note Call from Patient Call back at Home Phone [phone]   Caller: Patient-live call Summary of Call: Please call in a cough syrup to [facility] on [address]. please call pt when done. Initial call taken by: Warnell Forester,  December 01, 2009 2:04 PM  Follow-up for Phone Call        hydromet cough syrup called in Follow-up by: Kern Reap CMA Duncan Dull),  December 01, 2009 2:27 PM  Additional Follow-up for Phone Call Additional follow up Details #1::        Provider Notified Additional Follow-up by: Roderick Pee MD,  December 02, 2009 8:49 AM

## 2011-08-03 ENCOUNTER — Other Ambulatory Visit: Payer: Self-pay | Admitting: Family Medicine

## 2011-08-03 DIAGNOSIS — Z1231 Encounter for screening mammogram for malignant neoplasm of breast: Secondary | ICD-10-CM

## 2011-08-07 ENCOUNTER — Other Ambulatory Visit: Payer: Self-pay | Admitting: Family Medicine

## 2011-08-21 ENCOUNTER — Ambulatory Visit (INDEPENDENT_AMBULATORY_CARE_PROVIDER_SITE_OTHER): Payer: Medicare Other | Admitting: Family Medicine

## 2011-08-21 ENCOUNTER — Encounter: Payer: Self-pay | Admitting: Family Medicine

## 2011-08-21 VITALS — BP 120/78 | Temp 98.4°F | Ht 61.0 in | Wt 164.0 lb

## 2011-08-21 DIAGNOSIS — L258 Unspecified contact dermatitis due to other agents: Secondary | ICD-10-CM

## 2011-08-21 MED ORDER — PREDNISONE 20 MG PO TABS: ORAL_TABLET | ORAL | Status: DC

## 2011-08-21 NOTE — Progress Notes (Signed)
  Subjective:    Patient ID: Kayla Alvarez, female    DOB: Jun 12, 1934, 77 y.o.   MRN: [id number]  HPIDoris is a 77 year old, married female, nonsmoker, who comes in today with a contact dermatitis for one week.  She developed a contact dermatitis.  A week ago that involved both arms now.  It is spread to the right chest and a groin area.  She's had a history of poison ivy in the past    Review of Systems General dermatologic review of systems otherwise negative    Objective:   Physical Exam  Well-developed well-nourished, female, in no acute distress.  Examination of his skin shows a contact dermatitis involving both arms.  Right chest and right abdomen,      Assessment & Plan:  Diffuse contact dermatitis.  Plan prednisone burst and taper

## 2011-08-21 NOTE — Patient Instructions (Signed)
Take the prednisone as directed.  You can also use small amounts of over-the-counter cortisone cream twice daily.  10 mg of Claritin at bedtime as needed for the itching.  Return p.r.n.

## 2011-08-23 ENCOUNTER — Ambulatory Visit
Admission: RE | Admit: 2011-08-23 | Discharge: 2011-08-23 | Disposition: A | Discharge status: Home / Self Care | Payer: Medicare Other | Source: Ambulatory Visit | Attending: Family Medicine | Admitting: Family Medicine

## 2011-08-23 DIAGNOSIS — Z1231 Encounter for screening mammogram for malignant neoplasm of breast: Secondary | ICD-10-CM

## 2011-09-04 ENCOUNTER — Encounter: Payer: Self-pay | Admitting: Family Medicine

## 2011-09-06 ENCOUNTER — Encounter: Payer: Self-pay | Admitting: Family Medicine

## 2011-09-06 ENCOUNTER — Ambulatory Visit (INDEPENDENT_AMBULATORY_CARE_PROVIDER_SITE_OTHER): Payer: Medicare Other | Admitting: Family Medicine

## 2011-09-06 DIAGNOSIS — Z136 Encounter for screening for cardiovascular disorders: Secondary | ICD-10-CM

## 2011-09-06 DIAGNOSIS — R609 Edema, unspecified: Secondary | ICD-10-CM

## 2011-09-06 DIAGNOSIS — Z0000 Encounter for general adult medical examination without abnormal findings: Secondary | ICD-10-CM

## 2011-09-06 DIAGNOSIS — E785 Hyperlipidemia, unspecified: Secondary | ICD-10-CM

## 2011-09-06 LAB — BASIC METABOLIC PANEL
BUN: 12 mg/dL (ref 6–23)
Chloride: 108 mEq/L (ref 96–112)
Potassium: 3.9 mEq/L (ref 3.5–5.1)

## 2011-09-06 LAB — CBC WITH DIFFERENTIAL/PLATELET
Basophils Relative: 0.6 % (ref 0.0–3.0)
Eosinophils Relative: 4.1 % (ref 0.0–5.0)
HCT: 42 % (ref 36.0–46.0)
Hemoglobin: 14.1 g/dL (ref 12.0–15.0)
Lymphs Abs: 2.2 10*3/uL (ref 0.7–4.0)
Monocytes Relative: 9.9 % (ref 3.0–12.0)
Neutro Abs: 4.4 10*3/uL (ref 1.4–7.7)
WBC: 7.8 10*3/uL (ref 4.5–10.5)

## 2011-09-06 LAB — POCT URINALYSIS DIPSTICK
Bilirubin, UA: NEGATIVE
Ketones, UA: NEGATIVE
pH, UA: 5.5

## 2011-09-06 LAB — LIPID PANEL
HDL: 58.5 mg/dL (ref >39.00)
LDL Cholesterol: 105 mg/dL — ABNORMAL HIGH (ref 0–99)
Total CHOL/HDL Ratio: 3
Triglycerides: 57 mg/dL (ref 0.0–149.0)

## 2011-09-06 LAB — HEPATIC FUNCTION PANEL: Albumin: 3.7 g/dL (ref 3.5–5.2)

## 2011-09-06 MED ORDER — SIMVASTATIN 20 MG PO TABS: 20.0000 mg | ORAL_TABLET | Freq: Every day | ORAL | Status: DC

## 2011-09-06 MED ORDER — FUROSEMIDE 20 MG PO TABS: 20.0000 mg | ORAL_TABLET | Freq: Every day | ORAL | Status: DC

## 2011-09-06 NOTE — Patient Instructions (Signed)
Continue your current medications.  Remember to take a baby aspirin daily, along with the Zocor.  Start a walking program 30 minutes daily.  We strongly recommend the flu vaccine and the shingles.  Return in one year, sooner if any problem

## 2011-09-06 NOTE — Progress Notes (Signed)
  Subjective:    Patient ID: Kayla Alvarez, female    DOB: 24-May-1934, 77 y.o.   MRN: [id number]  HPI [name] is a delightful, 77 year old, married female, nonsmoker, who comes in today for a Medicare wellness examination because of a history of venous insufficiency, peripheral edema, and hyperlipidemia.  She's always been in excellent health.  Has had no chronic health problems except for the above.  She takes 20 mg of Lasix daily to prevent peripheral edema and 20 mg of Zocor at bedtime for hyperlipidemia.  She gets routine eye care, occasional dental care, BSE monthly, and a mammography, colonoscopy 5 years ago, normal, tetanus, 2010, Pneumovax, x 2, information given on shingles.  She declines a flu vaccine after a long explanation by me.  Cognitive function, normal.  She does not walk on a daily basis.  Home health safety reviewed.  New issues identified.  No guns in the house.  She does have a healthcare power of attorney and living will.  Explain the current recommendations for a Pap every 3, years she's been asymptomatic never had a problem to explain the Pap is only good for cervical cancer, and she is a low risk category.  Explained uterine cancer.  Would prevent present with bleeding and there is no good screening test for ovarian cancer.  Her   Review of Systems  Constitutional: Negative.   HENT: Negative.   Eyes: Negative.   Respiratory: Negative.   Cardiovascular: Negative.   Gastrointestinal: Negative.   Genitourinary: Negative.   Musculoskeletal: Negative.   Neurological: Negative.   Hematological: Negative.   Psychiatric/Behavioral: Negative.        Objective:   Physical Exam  Constitutional: She appears well-developed and well-nourished.  HENT:  Head: Normocephalic and atraumatic.  Right Ear: External ear normal.  Left Ear: External ear normal.  Nose: Nose normal.  Mouth/Throat: Oropharynx is clear and moist.  Eyes: EOM are normal. Pupils are equal, round, and  reactive to light.  Neck: Normal range of motion. Neck supple. No thyromegaly present.  Cardiovascular: Normal rate, regular rhythm, normal heart sounds and intact distal pulses.  Exam reveals no gallop and no friction rub.   No murmur heard. Pulmonary/Chest: Effort normal and breath sounds normal.  Abdominal: Soft. Bowel sounds are normal. She exhibits no distension and no mass. There is no tenderness. There is no rebound.  Genitourinary:       Bilateral breast exam normal  Musculoskeletal: Normal range of motion.  Lymphadenopathy:    She has no cervical adenopathy.  Neurological: She is alert. She has normal reflexes. No cranial nerve deficit. She exhibits normal muscle tone. Coordination normal.  Skin: Skin is warm and dry.  Psychiatric: She has a normal mood and affect. Her behavior is normal. Judgment and thought content normal.          Assessment & Plan:  Healthy female.  Venous insufficiency.  Continue Lasix 20 daily.  Hyperlipidemia.  Continue Zocor 20 mg daily and a baby aspirin.  Check labs.  Return one year or sooner if any problems.  Strongly recommend the flu. and shingles

## 2012-02-07 ENCOUNTER — Other Ambulatory Visit: Payer: Self-pay | Admitting: Dermatology

## 2012-02-12 ENCOUNTER — Ambulatory Visit (INDEPENDENT_AMBULATORY_CARE_PROVIDER_SITE_OTHER): Payer: Medicare Other | Admitting: Family

## 2012-02-12 ENCOUNTER — Encounter: Payer: Self-pay | Admitting: Family

## 2012-02-12 VITALS — BP 134/84 | Temp 98.5°F | Wt 161.0 lb

## 2012-02-12 DIAGNOSIS — J209 Acute bronchitis, unspecified: Secondary | ICD-10-CM

## 2012-02-12 DIAGNOSIS — R059 Cough, unspecified: Secondary | ICD-10-CM

## 2012-02-12 DIAGNOSIS — R05 Cough: Secondary | ICD-10-CM

## 2012-02-12 MED ORDER — AZITHROMYCIN 250 MG PO TABS: ORAL_TABLET | ORAL | Status: AC

## 2012-02-12 MED ORDER — GUAIFENESIN-CODEINE 100-10 MG/5ML PO SYRP: 5.0000 mL | ORAL_SOLUTION | Freq: Three times a day (TID) | ORAL | Status: AC | PRN

## 2012-02-12 MED ORDER — METHYLPREDNISOLONE 4 MG PO KIT: PACK | ORAL | Status: AC

## 2012-02-12 NOTE — Progress Notes (Signed)
Subjective:    Patient ID: Kayla Alvarez, female    DOB: 1934/03/22, 78 y.o.   MRN: [id number]  HPI Comments: 78 yo white female c/o congestion, constant/hacking productive cough with clear white sputum expectorated, and popping in her ears with cough x two weeks unrelieved with OTC dayquil and nyquil.  Denies chills, sorethroat, headache, shortness of breath, cp, or sinus drainage.      Review of Systems  Constitutional: Negative.   HENT: Positive for congestion. Negative for hearing loss, ear pain, nosebleeds, sore throat, facial swelling, rhinorrhea, sneezing, drooling, mouth sores, neck pain, neck stiffness, dental problem, postnasal drip, sinus pressure, tinnitus and ear discharge.   Eyes: Negative.   Respiratory: Negative.   Cardiovascular: Negative.   Gastrointestinal: Negative.   Neurological: Negative.        Objective: Past Medical History  Diagnosis Date  . ALLERGIC RHINITIS 06/05/2007  . BREAST LUMP 05/19/2010  . CONTACT DERMATITIS&OTH ECZEMA DUE OTH SPEC AGENT 02/02/2010  . Edema 07/19/2009  . HERPES ZOSTER 11/28/2009  . HYPERLIPIDEMIA 06/05/2007  . PULMONARY NODULE, LEFT UPPER LOBE 07/26/2009  . RENAL INSUFFICIENCY 06/05/2007  . UTI 09/03/2008  . VAGINITIS, ATROPHIC 06/26/2007  . Glaucoma   . Venous insufficiency     History   Social History  . Marital Status: Married    Spouse Name: N/A    Number of Children: N/A  . Years of Education: N/A   Occupational History  . Not on file.   Social History Main Topics  . Smoking status: Never Smoker   . Smokeless tobacco: Not on file  . Alcohol Use: No  . Drug Use:   . Sexually Active:    Other Topics Concern  . Not on file   Social History Narrative  . No narrative on file    Past Surgical History  Procedure Date  . Hemorrhoid surgery   . Eye surgery     Family History  Problem Relation Age of Onset  . Hypertension Neg Hx     family hx  . Stroke Neg Hx     family hx  . Heart disease Neg Hx     family  hx MI  . Cancer Neg Hx     breast ca    No Known Allergies  Current Outpatient Prescriptions on File Prior to Visit  Medication Sig Dispense Refill  . furosemide (LASIX) 20 MG tablet Take 1 tablet (20 mg total) by mouth daily.  100 tablet  3  . simvastatin (ZOCOR) 20 MG tablet Take 1 tablet (20 mg total) by mouth at bedtime.  100 tablet  3  . predniSONE (DELTASONE) 20 MG tablet Two tabs x 3 days, one tab x 3 days, a half a tab x 3 days, then a half a tablet Monday, Wednesday, Friday, for a two week taper  30 tablet  1    BP 134/84  Temp(Src) 98.5 F (36.9 C) (Oral)  Wt 161 lb (73.029 kg)chart   Physical Exam  Constitutional: She is oriented to person, place, and time. She appears well-developed and well-nourished. No distress.  HENT:  Right Ear: External ear normal.  Left Ear: External ear normal.  Nose: Nose normal.  Mouth/Throat: Oropharynx is clear and moist.  Eyes: Conjunctivae are normal. Right eye exhibits no discharge. Left eye exhibits no discharge.  Cardiovascular: Normal rate, regular rhythm, normal heart sounds and intact distal pulses.  Exam reveals no gallop and no friction rub.   No murmur heard. Pulmonary/Chest: Effort normal and  breath sounds normal. No respiratory distress. She has no wheezes. She has no rales. She exhibits no tenderness.  Neurological: She is alert and oriented to person, place, and time.  Skin: Skin is warm and dry. She is not diaphoretic.          Assessment & Plan:  Assessment; Bronchitis Plan: Tussinex, prednisone, z-pack, increase po fluids and rest. Teaching handouts on diagnosis and treatments provided. Encouraged to RTC if s/s get worse. Opportunity for questions provided.

## 2012-02-12 NOTE — Patient Instructions (Signed)

## 2012-08-15 ENCOUNTER — Other Ambulatory Visit: Payer: Self-pay | Admitting: Family Medicine

## 2012-08-15 DIAGNOSIS — Z1231 Encounter for screening mammogram for malignant neoplasm of breast: Secondary | ICD-10-CM

## 2012-09-12 ENCOUNTER — Encounter: Payer: Self-pay | Admitting: Family Medicine

## 2012-09-12 ENCOUNTER — Ambulatory Visit (INDEPENDENT_AMBULATORY_CARE_PROVIDER_SITE_OTHER): Payer: Medicare Other | Admitting: Family Medicine

## 2012-09-12 VITALS — BP 120/80 | Temp 98.1°F | Ht 61.0 in | Wt 161.0 lb

## 2012-09-12 DIAGNOSIS — E785 Hyperlipidemia, unspecified: Secondary | ICD-10-CM

## 2012-09-12 DIAGNOSIS — Z0000 Encounter for general adult medical examination without abnormal findings: Secondary | ICD-10-CM

## 2012-09-12 DIAGNOSIS — R609 Edema, unspecified: Secondary | ICD-10-CM

## 2012-09-12 DIAGNOSIS — N259 Disorder resulting from impaired renal tubular function, unspecified: Secondary | ICD-10-CM

## 2012-09-12 DIAGNOSIS — N952 Postmenopausal atrophic vaginitis: Secondary | ICD-10-CM

## 2012-09-12 LAB — CBC WITH DIFFERENTIAL/PLATELET
Basophils Relative: 0.9 % (ref 0.0–3.0)
Eosinophils Relative: 3.6 % (ref 0.0–5.0)
HCT: 42.6 % (ref 36.0–46.0)
Hemoglobin: 14.4 g/dL (ref 12.0–15.0)
Lymphs Abs: 2.2 10*3/uL (ref 0.7–4.0)
MCV: 90.4 fl (ref 78.0–100.0)
Monocytes Absolute: 0.6 10*3/uL (ref 0.1–1.0)
Monocytes Relative: 9.4 % (ref 3.0–12.0)
Neutro Abs: 3.4 10*3/uL (ref 1.4–7.7)
WBC: 6.6 10*3/uL (ref 4.5–10.5)

## 2012-09-12 LAB — LIPID PANEL
Cholesterol: 178 mg/dL (ref 0–200)
HDL: 55.9 mg/dL (ref >39.00)
LDL Cholesterol: 111 mg/dL — ABNORMAL HIGH (ref 0–99)
Total CHOL/HDL Ratio: 3
Triglycerides: 54 mg/dL (ref 0.0–149.0)

## 2012-09-12 LAB — POCT URINALYSIS DIPSTICK
Bilirubin, UA: NEGATIVE
Ketones, UA: NEGATIVE
Spec Grav, UA: 1.025
pH, UA: 6

## 2012-09-12 LAB — BASIC METABOLIC PANEL
CO2: 28 mEq/L (ref 19–32)
Chloride: 106 mEq/L (ref 96–112)
Potassium: 4.1 mEq/L (ref 3.5–5.1)
Sodium: 141 mEq/L (ref 135–145)

## 2012-09-12 LAB — HEPATIC FUNCTION PANEL
Albumin: 4 g/dL (ref 3.5–5.2)
Total Protein: 6.5 g/dL (ref 6.0–8.3)

## 2012-09-12 MED ORDER — FUROSEMIDE 20 MG PO TABS: 20.0000 mg | ORAL_TABLET | Freq: Every day | ORAL | Status: DC

## 2012-09-12 MED ORDER — ESTRADIOL 0.1 MG/GM VA CREA: 2.0000 g | TOPICAL_CREAM | Freq: Every day | VAGINAL | Status: DC

## 2012-09-12 MED ORDER — SIMVASTATIN 20 MG PO TABS: 20.0000 mg | ORAL_TABLET | Freq: Every day | ORAL | Status: DC

## 2012-09-12 NOTE — Progress Notes (Signed)
  Subjective:    Patient ID: Kayla Alvarez, female    DOB: 1934-06-05, 78 y.o.   MRN: [id number]  HPI this is a 78 year old married female nonsmoker who comes in today for a Medicare wellness examination because of a history of peripheral edema and hyperlipidemia and atrophic vaginitis  Meds reviewed no changes except she's not using a hormonal cream  She gets routine eye care recently had cataracts removed, regular dental care, annual mammogram but she does not do BSE monthly. Colonoscopy was normal tetanus 2010 Pneumovax x2 she declines a flu shot after complete explanation information given on shingles vaccines  Her cognitive function is normal she walks on a daily basis home health safety reviewed no issues identified, no guns in the house, she does have a health care power of attorney and living well  She sees her dermatologist yearly in May because of a history of sun damage    Review of Systems  Constitutional: Negative.   HENT: Negative.   Eyes: Negative.   Respiratory: Negative.   Cardiovascular: Negative.   Gastrointestinal: Negative.   Genitourinary: Negative.   Musculoskeletal: Negative.   Neurological: Negative.   Hematological: Negative.   Psychiatric/Behavioral: Negative.        Objective:   Physical Exam  Constitutional: She appears well-developed and well-nourished.  HENT:  Head: Normocephalic and atraumatic.  Right Ear: External ear normal.  Left Ear: External ear normal.  Nose: Nose normal.  Mouth/Throat: Oropharynx is clear and moist.  Eyes: EOM are normal. Pupils are equal, round, and reactive to light.  Neck: Normal range of motion. Neck supple. No thyromegaly present.  Cardiovascular: Normal rate, regular rhythm, normal heart sounds and intact distal pulses.  Exam reveals no gallop and no friction rub.   No murmur heard. Pulmonary/Chest: Effort normal and breath sounds normal.  Abdominal: Soft. Bowel sounds are normal. She exhibits no distension and  no mass. There is no tenderness. There is no rebound.  Genitourinary:       Breast exam normal BSE was taught  Musculoskeletal: Normal range of motion.  Lymphadenopathy:    She has no cervical adenopathy.  Neurological: She is alert. She has normal reflexes. No cranial nerve deficit. She exhibits normal muscle tone. Coordination normal.  Skin: Skin is warm and dry.  Psychiatric: She has a normal mood and affect. Her behavior is normal. Judgment and thought content normal.          Assessment & Plan:  Healthy female  Peripheral edema continue Lasix 20 mg daily  Hyperlipidemia continue Zocor and an aspirin tablet daily  Postmenopausal vaginal dryness pulmonal cream twice weekly

## 2012-09-12 NOTE — Patient Instructions (Signed)
Continue the Lasix and Zocor daily  Use small amounts of the hormonal cream 2-3 times weekly  Return in one year sooner if any problems  When you set up a physical exam for next year tell them you're taking Zocor for hyperlipidemia that way you can have your labs a week at a time

## 2012-09-16 ENCOUNTER — Ambulatory Visit
Admission: RE | Admit: 2012-09-16 | Discharge: 2012-09-16 | Disposition: A | Discharge status: Home / Self Care | Payer: Medicare Other | Source: Ambulatory Visit | Attending: Family Medicine | Admitting: Family Medicine

## 2012-09-16 DIAGNOSIS — Z1231 Encounter for screening mammogram for malignant neoplasm of breast: Secondary | ICD-10-CM

## 2012-12-09 ENCOUNTER — Other Ambulatory Visit (INDEPENDENT_AMBULATORY_CARE_PROVIDER_SITE_OTHER): Payer: Medicare Other

## 2012-12-09 ENCOUNTER — Other Ambulatory Visit: Payer: Medicare Other

## 2012-12-09 DIAGNOSIS — R739 Hyperglycemia, unspecified: Secondary | ICD-10-CM

## 2012-12-09 DIAGNOSIS — R7309 Other abnormal glucose: Secondary | ICD-10-CM

## 2012-12-09 LAB — BASIC METABOLIC PANEL
CO2: 27 mEq/L (ref 19–32)
Calcium: 9 mg/dL (ref 8.4–10.5)
Chloride: 107 mEq/L (ref 96–112)
Creatinine, Ser: 0.9 mg/dL (ref 0.4–1.2)
Sodium: 141 mEq/L (ref 135–145)

## 2012-12-09 LAB — HEMOGLOBIN A1C: Hgb A1c MFr Bld: 6.3 % (ref 4.6–6.5)

## 2013-04-09 ENCOUNTER — Other Ambulatory Visit: Payer: Self-pay | Admitting: Dermatology

## 2013-08-20 ENCOUNTER — Other Ambulatory Visit: Payer: Self-pay

## 2013-08-20 DIAGNOSIS — Z1231 Encounter for screening mammogram for malignant neoplasm of breast: Secondary | ICD-10-CM

## 2013-08-21 ENCOUNTER — Ambulatory Visit (INDEPENDENT_AMBULATORY_CARE_PROVIDER_SITE_OTHER): Payer: Medicare Other | Admitting: Family Medicine

## 2013-08-21 ENCOUNTER — Encounter: Payer: Self-pay | Admitting: Family Medicine

## 2013-08-21 ENCOUNTER — Ambulatory Visit (INDEPENDENT_AMBULATORY_CARE_PROVIDER_SITE_OTHER)
Admission: RE | Admit: 2013-08-21 | Discharge: 2013-08-21 | Disposition: A | Discharge status: Home / Self Care | Payer: Medicare Other | Source: Ambulatory Visit | Attending: Family Medicine | Admitting: Family Medicine

## 2013-08-21 VITALS — BP 130/90 | HR 85 | Temp 98.2°F | Wt 163.0 lb

## 2013-08-21 DIAGNOSIS — J45909 Unspecified asthma, uncomplicated: Secondary | ICD-10-CM

## 2013-08-21 DIAGNOSIS — L258 Unspecified contact dermatitis due to other agents: Secondary | ICD-10-CM

## 2013-08-21 MED ORDER — ALBUTEROL SULFATE HFA 108 (90 BASE) MCG/ACT IN AERS: INHALATION_SPRAY | RESPIRATORY_TRACT | Status: DC

## 2013-08-21 MED ORDER — PREDNISONE 20 MG PO TABS: ORAL_TABLET | ORAL | Status: DC

## 2013-08-21 NOTE — Patient Instructions (Signed)
Prednisone 20 mg,,,,,,,,,,, 3 tabs daily for 3 days or in today you feel significant improvement and then taper  Chest x-ray today  Albuterol 2 puffs 3 times daily  Followup in one week

## 2013-08-21 NOTE — Progress Notes (Signed)
  Subjective:    Patient ID: Kayla Alvarez, female    DOB: 1934-09-13, 79 y.o.   MRN: [id number]  HPI Kayla Alvarez is a 79 year old married female........ smoked from age [age]............. who comes in today for evaluation of a cough for one month  About a month ago she began coughing and wheezing. It's gotten worse. She's never had a history of allergic rhinitis in the past. She smoked from age [age] and then quit. She was not around anything toxic. She was working indoors at [employer]   Review of Systems    and pulmonary review of systems otherwise negative Objective:   Physical Exam Well-developed and nourished female no acute distress examination of the HEENT negative neck was supple lungs show decreased breath sounds inspiratory and expiratory moderate wheezing.       Assessment & Plan:  Asthma Set plan complete workup

## 2013-08-28 ENCOUNTER — Encounter: Payer: Self-pay | Admitting: Family Medicine

## 2013-08-28 ENCOUNTER — Ambulatory Visit (INDEPENDENT_AMBULATORY_CARE_PROVIDER_SITE_OTHER): Payer: Medicare Other | Admitting: Family Medicine

## 2013-08-28 VITALS — BP 120/80 | Temp 98.4°F | Wt 172.0 lb

## 2013-08-28 DIAGNOSIS — J45909 Unspecified asthma, uncomplicated: Secondary | ICD-10-CM

## 2013-08-28 NOTE — Patient Instructions (Signed)
Prednisone 20 mg tablets,,,,,,,,, one half tab today Friday and Saturday,,,,,,, skip Sunday,,,,,,,,,,, starting next week one half tab Monday Wednesday Friday for a 3 week taper  Return when necessary

## 2013-08-28 NOTE — Progress Notes (Signed)
  Subjective:    Patient ID: Kayla Alvarez, female    DOB: 17-Feb-1934, 79 y.o.   MRN: [id number]  HPI Kayla Alvarez is a 79 year old widowed female who comes in today for followup of asthma  We saw her about a month ago with a flare of her asthma. We started her on prednisone 60 mg daily she's taper down to one pill daily and a 75% better.   Review of Systems Review of systems otherwise negative    Objective:   Physical Exam  Well-developed well-nourished female no acute distress vital signs stable she is afebrile pulmonary exam normal no wheezing      Assessment & Plan:  Asthma resolved plan taper prednisone

## 2013-09-15 ENCOUNTER — Ambulatory Visit (INDEPENDENT_AMBULATORY_CARE_PROVIDER_SITE_OTHER): Payer: Medicare Other | Admitting: Family Medicine

## 2013-09-15 ENCOUNTER — Encounter: Payer: Self-pay | Admitting: Family Medicine

## 2013-09-15 VITALS — BP 120/80 | Temp 98.1°F | Ht 61.0 in | Wt 165.0 lb

## 2013-09-15 DIAGNOSIS — J45909 Unspecified asthma, uncomplicated: Secondary | ICD-10-CM

## 2013-09-15 DIAGNOSIS — R609 Edema, unspecified: Secondary | ICD-10-CM

## 2013-09-15 DIAGNOSIS — J984 Other disorders of lung: Secondary | ICD-10-CM

## 2013-09-15 DIAGNOSIS — Z0000 Encounter for general adult medical examination without abnormal findings: Secondary | ICD-10-CM

## 2013-09-15 DIAGNOSIS — N259 Disorder resulting from impaired renal tubular function, unspecified: Secondary | ICD-10-CM

## 2013-09-15 DIAGNOSIS — E785 Hyperlipidemia, unspecified: Secondary | ICD-10-CM

## 2013-09-15 LAB — BASIC METABOLIC PANEL
CO2: 28 mEq/L (ref 19–32)
Calcium: 9.1 mg/dL (ref 8.4–10.5)
Chloride: 105 mEq/L (ref 96–112)
Sodium: 141 mEq/L (ref 135–145)

## 2013-09-15 LAB — CBC WITH DIFFERENTIAL/PLATELET
Basophils Relative: 0.8 % (ref 0.0–3.0)
Eosinophils Absolute: 0.4 10*3/uL (ref 0.0–0.7)
Eosinophils Relative: 6.4 % — ABNORMAL HIGH (ref 0.0–5.0)
HCT: 42.5 % (ref 36.0–46.0)
Hemoglobin: 14.3 g/dL (ref 12.0–15.0)
MCHC: 33.8 g/dL (ref 30.0–36.0)
MCV: 91.3 fl (ref 78.0–100.0)
Monocytes Absolute: 0.6 10*3/uL (ref 0.1–1.0)
Monocytes Relative: 10 % (ref 3.0–12.0)
Neutro Abs: 2.7 10*3/uL (ref 1.4–7.7)
Neutrophils Relative %: 48.3 % (ref 43.0–77.0)
RBC: 4.65 Mil/uL (ref 3.87–5.11)
WBC: 5.6 10*3/uL (ref 4.5–10.5)

## 2013-09-15 LAB — HEPATIC FUNCTION PANEL
Bilirubin, Direct: 0.1 mg/dL (ref 0.0–0.3)
Total Bilirubin: 0.6 mg/dL (ref 0.3–1.2)

## 2013-09-15 LAB — LIPID PANEL
HDL: 54.4 mg/dL (ref >39.00)
Total CHOL/HDL Ratio: 4
Triglycerides: 113 mg/dL (ref 0.0–149.0)
VLDL: 22.6 mg/dL (ref 0.0–40.0)

## 2013-09-15 LAB — TSH: TSH: 1.24 u[IU]/mL (ref 0.35–5.50)

## 2013-09-15 LAB — POCT URINALYSIS DIPSTICK
Ketones, UA: NEGATIVE
Protein, UA: NEGATIVE
Spec Grav, UA: 1.025
Urobilinogen, UA: 0.2

## 2013-09-15 LAB — LDL CHOLESTEROL, DIRECT: Direct LDL: 145.8 mg/dL

## 2013-09-15 MED ORDER — SIMVASTATIN 20 MG PO TABS: 20.0000 mg | ORAL_TABLET | Freq: Every day | ORAL | Status: DC

## 2013-09-15 MED ORDER — FUROSEMIDE 20 MG PO TABS: 20.0000 mg | ORAL_TABLET | Freq: Every day | ORAL | Status: DC

## 2013-09-15 MED ORDER — ALBUTEROL SULFATE HFA 108 (90 BASE) MCG/ACT IN AERS: INHALATION_SPRAY | RESPIRATORY_TRACT | Status: DC

## 2013-09-15 NOTE — Patient Instructions (Signed)
Continue current medications  We will call you with a report of your lab work  Do a thorough breast exam monthly  Call GI and find out when you're due for your next colonoscopy,,,,,,, [phone]  Return in one year sooner if any problem

## 2013-09-15 NOTE — Progress Notes (Signed)
Pre visit review using our clinic review tool, if applicable. No additional management support is needed unless otherwise documented below in the visit note. 

## 2013-09-15 NOTE — Progress Notes (Signed)
  Subjective:    Patient ID: [name], female    DOB: 01-18-1934, 79 y.o.   MRN: [id number]  HPI Kayla Alvarez is a 79 year old married female nonsmoker who comes in today for her yearly Medicare wellness examination  It's she has a history of postmenopausal vaginal dryness however she's not using the hormonal cream is too expensive  She is a history of fluid retention and venous insufficiency and takes Lasix 20 mg daily  She also has hyperlipidemia and takes Zocor 20 mg daily  She gets asthma when she gets a bad cold and has albuterol at home to take when necessary  She gets routine eye care, dental care, does not do BSE monthly, does get and you mammography, colonoscopy and GI,  Home health safety reviewed no issues identified, no guns in the house, she does have a health care power of attorney and living well.  She's not walking on a regular basis   Review of Systems  Constitutional: Negative.   HENT: Negative.   Eyes: Negative.   Respiratory: Negative.   Cardiovascular: Negative.   Gastrointestinal: Negative.   Endocrine: Negative.   Genitourinary: Negative.   Musculoskeletal: Negative.   Allergic/Immunologic: Negative.   Neurological: Negative.   Hematological: Negative.   Psychiatric/Behavioral: Negative.        Objective:   Physical Exam  Constitutional: She appears well-developed and well-nourished.  HENT:  Head: Normocephalic and atraumatic.  Right Ear: External ear normal.  Left Ear: External ear normal.  Nose: Nose normal.  Mouth/Throat: Oropharynx is clear and moist.  Eyes: EOM are normal. Pupils are equal, round, and reactive to light.  Neck: Normal range of motion. Neck supple. No thyromegaly present.  Cardiovascular: Normal rate, regular rhythm, normal heart sounds and intact distal pulses.  Exam reveals no gallop and no friction rub.   No murmur heard. Pulmonary/Chest: Effort normal and breath sounds normal.  Abdominal: Soft. Bowel sounds are normal.  She exhibits no distension and no mass. There is no tenderness. There is no rebound.  Genitourinary:    Bilateral breast exam normal advised and shown how to do BSE monthly Pelvic and Pap 2 years ago normal no symptoms therefore not repeated  Musculoskeletal: Normal range of motion.  Lymphadenopathy:    She has no cervical adenopathy.  Neurological: She is alert. She has normal reflexes. No cranial nerve deficit. She exhibits normal muscle tone. Coordination normal.  Skin: Skin is warm and dry.  Total body skin exam normal  Psychiatric: She has a normal mood and affect. Her behavior is normal. Judgment and thought content normal.          Assessment & Plan:  Healthy female  Fluid retention with venous insufficiency Lasix 20 mg daily  Hyperlipidemia simvastatin 20 mg daily check labs  Asthma with a viral syndrome albuterol when necessary  Postmenopausal vaginal dryness she declines the steroid cream because of its cost

## 2013-09-17 ENCOUNTER — Ambulatory Visit
Admission: RE | Admit: 2013-09-17 | Discharge: 2013-09-17 | Disposition: A | Discharge status: Home / Self Care | Payer: Medicare Other | Source: Ambulatory Visit

## 2013-09-17 DIAGNOSIS — Z1231 Encounter for screening mammogram for malignant neoplasm of breast: Secondary | ICD-10-CM

## 2014-04-14 ENCOUNTER — Encounter: Payer: Self-pay | Admitting: Family Medicine

## 2014-04-14 ENCOUNTER — Ambulatory Visit (INDEPENDENT_AMBULATORY_CARE_PROVIDER_SITE_OTHER): Payer: Medicare HMO | Admitting: Family Medicine

## 2014-04-14 VITALS — BP 120/80 | Temp 98.7°F | Wt 156.0 lb

## 2014-04-14 DIAGNOSIS — L258 Unspecified contact dermatitis due to other agents: Secondary | ICD-10-CM

## 2014-04-14 MED ORDER — PREDNISONE 20 MG PO TABS: ORAL_TABLET | ORAL | Status: DC

## 2014-04-14 NOTE — Progress Notes (Signed)
   Subjective:    Patient ID: Kayla Alvarez, female    DOB: Oct 13, 1934, 80 y.o.   MRN: [id number]  HPI  [name] is a 80 year old female comes in today for evaluation of a skin rash  On Sunday she woke up with a. Rash on her back. It's nonpainful  She's had a history of contact dermatitis in the past  Review of Systems    review of systems negative Objective:   Physical Exam  Well-developed well-nourished female no acute distress examination the back shows red papules consistent with a contact dermatitis      Assessment & Plan:  Contact dermatitis,,,,,,,,,,,,,,,,,,,,, prednisone burst and taper,

## 2014-04-14 NOTE — Progress Notes (Signed)
Pre visit review using our clinic review tool, if applicable. No additional management support is needed unless otherwise documented below in the visit note. 

## 2014-04-14 NOTE — Patient Instructions (Signed)
Take the prednisone as directed  Return when necessary

## 2014-07-23 ENCOUNTER — Ambulatory Visit (INDEPENDENT_AMBULATORY_CARE_PROVIDER_SITE_OTHER): Payer: Medicare HMO | Admitting: Family Medicine

## 2014-07-23 ENCOUNTER — Encounter: Payer: Self-pay | Admitting: Family Medicine

## 2014-07-23 VITALS — BP 110/80 | Temp 98.6°F | Wt 157.0 lb

## 2014-07-23 DIAGNOSIS — J45909 Unspecified asthma, uncomplicated: Secondary | ICD-10-CM

## 2014-07-23 MED ORDER — PREDNISONE 20 MG PO TABS: ORAL_TABLET | ORAL | Status: DC

## 2014-07-23 MED ORDER — HYDROCODONE-HOMATROPINE 5-1.5 MG/5ML PO SYRP: ORAL_SOLUTION | ORAL | Status: DC

## 2014-07-23 NOTE — Progress Notes (Signed)
   Subjective:    Patient ID: Kayla Alvarez, female    DOB: [birth year], 80 y.o.   MRN: [id number]  HPI Kayla Alvarez is a 80 year old female nonsmoker who comes in today for evaluation of asthma  Every fall she develops wheezing. Probably triggered by her allergic reaction to ragweed. In the winter and spring and summer she is fairly asymptomatic except for some allergic rhinitis.   Review of Systems    review of systems otherwise negative Objective:   Physical Exam  Well-developed well-nourished female no acute distress vital signs stable she is afebrile HEENT negative neck was supple no adenopathy lungs are clear except for symmetrical late mild expiratory wheezing      Assessment & Plan:  Allergic asthma............Marland Kitchen prednisone burst and taper.

## 2014-07-23 NOTE — Patient Instructions (Signed)
Prednisone 20 mg......... 2 tabs x3 days then taper as outlined  Hydromet...........Marland Kitchen 1/2-1 teaspoon at bedtime when necessary for nighttime cough

## 2014-08-17 ENCOUNTER — Other Ambulatory Visit: Payer: Self-pay

## 2014-08-17 DIAGNOSIS — Z1231 Encounter for screening mammogram for malignant neoplasm of breast: Secondary | ICD-10-CM

## 2014-09-16 ENCOUNTER — Ambulatory Visit (INDEPENDENT_AMBULATORY_CARE_PROVIDER_SITE_OTHER): Payer: Medicare HMO | Admitting: Family Medicine

## 2014-09-16 ENCOUNTER — Ambulatory Visit (INDEPENDENT_AMBULATORY_CARE_PROVIDER_SITE_OTHER)
Admission: RE | Admit: 2014-09-16 | Discharge: 2014-09-16 | Disposition: A | Discharge status: Home / Self Care | Payer: Medicare HMO | Source: Ambulatory Visit | Attending: Family Medicine | Admitting: Family Medicine

## 2014-09-16 ENCOUNTER — Encounter: Payer: Self-pay | Admitting: Family Medicine

## 2014-09-16 VITALS — BP 120/80 | Temp 98.3°F | Ht 60.75 in | Wt 162.0 lb

## 2014-09-16 DIAGNOSIS — Z79899 Other long term (current) drug therapy: Secondary | ICD-10-CM

## 2014-09-16 DIAGNOSIS — R059 Cough, unspecified: Secondary | ICD-10-CM

## 2014-09-16 DIAGNOSIS — R609 Edema, unspecified: Secondary | ICD-10-CM

## 2014-09-16 DIAGNOSIS — J984 Other disorders of lung: Secondary | ICD-10-CM

## 2014-09-16 DIAGNOSIS — J45901 Unspecified asthma with (acute) exacerbation: Secondary | ICD-10-CM

## 2014-09-16 DIAGNOSIS — J309 Allergic rhinitis, unspecified: Secondary | ICD-10-CM

## 2014-09-16 DIAGNOSIS — R05 Cough: Secondary | ICD-10-CM

## 2014-09-16 DIAGNOSIS — N259 Disorder resulting from impaired renal tubular function, unspecified: Secondary | ICD-10-CM

## 2014-09-16 DIAGNOSIS — E785 Hyperlipidemia, unspecified: Secondary | ICD-10-CM

## 2014-09-16 LAB — CBC WITH DIFFERENTIAL/PLATELET
BASOS ABS: 0 10*3/uL (ref 0.0–0.1)
Basophils Relative: 0.5 % (ref 0.0–3.0)
EOS ABS: 0.2 10*3/uL (ref 0.0–0.7)
Eosinophils Relative: 3.3 % (ref 0.0–5.0)
HEMATOCRIT: 44 % (ref 36.0–46.0)
HEMOGLOBIN: 14.6 g/dL (ref 12.0–15.0)
LYMPHS ABS: 2.3 10*3/uL (ref 0.7–4.0)
LYMPHS PCT: 33.1 % (ref 12.0–46.0)
MCHC: 33.1 g/dL (ref 30.0–36.0)
MCV: 90.6 fl (ref 78.0–100.0)
Monocytes Absolute: 0.7 10*3/uL (ref 0.1–1.0)
Monocytes Relative: 10.1 % (ref 3.0–12.0)
Neutro Abs: 3.7 10*3/uL (ref 1.4–7.7)
Neutrophils Relative %: 53 % (ref 43.0–77.0)
PLATELETS: 239 10*3/uL (ref 150.0–400.0)
RBC: 4.86 Mil/uL (ref 3.87–5.11)
RDW: 14.5 % (ref 11.5–15.5)
WBC: 6.9 10*3/uL (ref 4.0–10.5)

## 2014-09-16 LAB — BASIC METABOLIC PANEL
BUN: 11 mg/dL (ref 6–23)
CO2: 29 mEq/L (ref 19–32)
Calcium: 9.4 mg/dL (ref 8.4–10.5)
Chloride: 106 mEq/L (ref 96–112)
Creatinine, Ser: 0.9 mg/dL (ref 0.4–1.2)
GFR: 66.94 mL/min (ref >60.00)
GLUCOSE: 119 mg/dL — AB (ref 70–99)
POTASSIUM: 4.5 meq/L (ref 3.5–5.1)
SODIUM: 144 meq/L (ref 135–145)

## 2014-09-16 LAB — POCT URINALYSIS DIPSTICK
BILIRUBIN UA: NEGATIVE
Glucose, UA: NEGATIVE
KETONES UA: NEGATIVE
NITRITE UA: NEGATIVE
Protein, UA: NEGATIVE
Spec Grav, UA: 1.02
Urobilinogen, UA: 0.2
pH, UA: 6.5

## 2014-09-16 LAB — TSH: TSH: 1.04 u[IU]/mL (ref 0.35–4.50)

## 2014-09-16 MED ORDER — SIMVASTATIN 20 MG PO TABS: 20.0000 mg | ORAL_TABLET | Freq: Every day | ORAL | Status: DC

## 2014-09-16 MED ORDER — FUROSEMIDE 20 MG PO TABS: 20.0000 mg | ORAL_TABLET | Freq: Every day | ORAL | Status: DC

## 2014-09-16 NOTE — Progress Notes (Signed)
   Subjective:    Patient ID: Kayla Alvarez, female    DOB: 1934-09-30, 80 y.o.   MRN: [id number]  HPI [name] is a delightful 80 year old married female who comes in today for evaluation of fluid retention peripheral edema, hyperlipidemia  He takes Zocor 20 mg daily we'll check labs  She takes Lasix 20 mg daily because of venous insufficiency and peripheral edema  She gets routine eye care, dental care, BSE monthly, and you mammography, does not recall when her last colonoscopy was.  Pap and pelvic 2 years ago normal deferred till next year  Cognitive function normal she walks daily home health safety reviewed no issues identified, no guns in the house, she does have a healthcare power of attorney and living well   Review of Systems  Constitutional: Negative.   HENT: Negative.   Eyes: Negative.   Respiratory: Negative.   Cardiovascular: Negative.   Gastrointestinal: Negative.   Endocrine: Negative.   Genitourinary: Negative.   Musculoskeletal: Negative.   Skin: Negative.   Allergic/Immunologic: Negative.   Neurological: Negative.   Hematological: Negative.   Psychiatric/Behavioral: Negative.        Objective:   Physical Exam  Constitutional: She appears well-developed and well-nourished.  HENT:  Head: Normocephalic and atraumatic.  Right Ear: External ear normal.  Left Ear: External ear normal.  Nose: Nose normal.  Mouth/Throat: Oropharynx is clear and moist.  Eyes: EOM are normal. Pupils are equal, round, and reactive to light.  Neck: Normal range of motion. Neck supple. No JVD present. No tracheal deviation present. No thyromegaly present.  Cardiovascular: Normal rate, regular rhythm, normal heart sounds and intact distal pulses.  Exam reveals no gallop and no friction rub.   No murmur heard. No carotid nor aortic bruits peripheral pulses 2+ and symmetrical  Pulmonary/Chest: Effort normal and breath sounds normal. No stridor. No respiratory distress. She has no  wheezes. She has no rales. She exhibits no tenderness.  Abdominal: Soft. Bowel sounds are normal. She exhibits no distension and no mass. There is no tenderness. There is no rebound and no guarding.  Genitourinary:  Bilateral breast exam normal  Musculoskeletal: Normal range of motion.  Lymphadenopathy:    She has no cervical adenopathy.  Neurological: She is alert. She has normal reflexes. No cranial nerve deficit. She exhibits normal muscle tone. Coordination normal.  Skin: Skin is warm and dry. No rash noted. No erythema. No pallor.  Psychiatric: She has a normal mood and affect. Her behavior is normal. Judgment and thought content normal.  Nursing note and vitals reviewed.         Assessment & Plan:  Healthy female  Hyperlipidemia...Marland KitchenMarland KitchenMarland Kitchen Check labs  Venous insufficiency........ Continue Lasix check labs

## 2014-09-16 NOTE — Progress Notes (Signed)
Pre visit review using our clinic review tool, if applicable. No additional management support is needed unless otherwise documented below in the visit note. 

## 2014-09-16 NOTE — Patient Instructions (Signed)
Continue current medications  Followup in 1 year sooner if any problem 

## 2014-09-18 ENCOUNTER — Telehealth: Payer: Self-pay | Admitting: *Deleted

## 2014-09-18 DIAGNOSIS — J984 Other disorders of lung: Secondary | ICD-10-CM

## 2014-09-18 DIAGNOSIS — N259 Disorder resulting from impaired renal tubular function, unspecified: Secondary | ICD-10-CM

## 2014-09-18 DIAGNOSIS — J45901 Unspecified asthma with (acute) exacerbation: Secondary | ICD-10-CM

## 2014-09-18 DIAGNOSIS — R609 Edema, unspecified: Secondary | ICD-10-CM

## 2014-09-18 DIAGNOSIS — E785 Hyperlipidemia, unspecified: Secondary | ICD-10-CM

## 2014-09-18 MED ORDER — ALBUTEROL SULFATE HFA 108 (90 BASE) MCG/ACT IN AERS: INHALATION_SPRAY | RESPIRATORY_TRACT | Status: DC

## 2014-09-18 NOTE — Telephone Encounter (Signed)
Sample available for patient to pick up. Left message on machine for patient.

## 2014-09-21 ENCOUNTER — Ambulatory Visit
Admission: RE | Admit: 2014-09-21 | Discharge: 2014-09-21 | Disposition: A | Discharge status: Home / Self Care | Payer: Medicare HMO | Source: Ambulatory Visit

## 2014-09-21 DIAGNOSIS — Z1231 Encounter for screening mammogram for malignant neoplasm of breast: Secondary | ICD-10-CM

## 2015-06-07 ENCOUNTER — Ambulatory Visit (INDEPENDENT_AMBULATORY_CARE_PROVIDER_SITE_OTHER): Payer: Medicare HMO | Admitting: Family Medicine

## 2015-06-07 ENCOUNTER — Encounter: Payer: Self-pay | Admitting: Family Medicine

## 2015-06-07 VITALS — BP 142/82 | HR 66 | Temp 97.8°F | Ht 60.0 in | Wt 165.2 lb

## 2015-06-07 DIAGNOSIS — E785 Hyperlipidemia, unspecified: Secondary | ICD-10-CM | POA: Diagnosis not present

## 2015-06-07 DIAGNOSIS — J309 Allergic rhinitis, unspecified: Secondary | ICD-10-CM

## 2015-06-07 DIAGNOSIS — R609 Edema, unspecified: Secondary | ICD-10-CM

## 2015-06-07 DIAGNOSIS — J45909 Unspecified asthma, uncomplicated: Secondary | ICD-10-CM | POA: Insufficient documentation

## 2015-06-07 DIAGNOSIS — J4520 Mild intermittent asthma, uncomplicated: Secondary | ICD-10-CM

## 2015-06-07 LAB — COMPREHENSIVE METABOLIC PANEL
ALT: 15 U/L (ref 0–35)
AST: 17 U/L (ref 0–37)
Albumin: 3.9 g/dL (ref 3.5–5.2)
Alkaline Phosphatase: 60 U/L (ref 39–117)
BUN: 9 mg/dL (ref 6–23)
CO2: 30 mEq/L (ref 19–32)
Calcium: 9.3 mg/dL (ref 8.4–10.5)
Chloride: 105 mEq/L (ref 96–112)
Creatinine, Ser: 0.89 mg/dL (ref 0.40–1.20)
GFR: 65.09 mL/min (ref >60.00)
GLUCOSE: 127 mg/dL — AB (ref 70–99)
Potassium: 4.3 mEq/L (ref 3.5–5.1)
SODIUM: 142 meq/L (ref 135–145)
TOTAL PROTEIN: 6.3 g/dL (ref 6.0–8.3)
Total Bilirubin: 0.5 mg/dL (ref 0.2–1.2)

## 2015-06-07 LAB — CBC WITH DIFFERENTIAL/PLATELET
BASOS PCT: 0.4 % (ref 0.0–3.0)
Basophils Absolute: 0 10*3/uL (ref 0.0–0.1)
EOS ABS: 0.3 10*3/uL (ref 0.0–0.7)
EOS PCT: 3.3 % (ref 0.0–5.0)
HEMATOCRIT: 43.1 % (ref 36.0–46.0)
HEMOGLOBIN: 14.4 g/dL (ref 12.0–15.0)
Lymphocytes Relative: 34.1 % (ref 12.0–46.0)
Lymphs Abs: 2.7 10*3/uL (ref 0.7–4.0)
MCHC: 33.3 g/dL (ref 30.0–36.0)
MCV: 90.3 fl (ref 78.0–100.0)
MONO ABS: 0.8 10*3/uL (ref 0.1–1.0)
Monocytes Relative: 10.4 % (ref 3.0–12.0)
Neutro Abs: 4.1 10*3/uL (ref 1.4–7.7)
Neutrophils Relative %: 51.8 % (ref 43.0–77.0)
Platelets: 235 10*3/uL (ref 150.0–400.0)
RBC: 4.77 Mil/uL (ref 3.87–5.11)
RDW: 13 % (ref 11.5–15.5)
WBC: 8 10*3/uL (ref 4.0–10.5)

## 2015-06-07 LAB — LIPID PANEL
Cholesterol: 157 mg/dL (ref 0–200)
HDL: 50.7 mg/dL (ref >39.00)
LDL Cholesterol: 91 mg/dL (ref 0–99)
NONHDL: 106.46
Total CHOL/HDL Ratio: 3
Triglycerides: 78 mg/dL (ref 0.0–149.0)
VLDL: 15.6 mg/dL (ref 0.0–40.0)

## 2015-06-07 LAB — TSH: TSH: 1.11 u[IU]/mL (ref 0.35–4.50)

## 2015-06-07 MED ORDER — FLUTICASONE-SALMETEROL 100-50 MCG/DOSE IN AEPB: 1.0000 | INHALATION_SPRAY | Freq: Two times a day (BID) | RESPIRATORY_TRACT | Status: DC

## 2015-06-07 NOTE — Patient Instructions (Addendum)
It was really nice to meet you. We will call you with your lab results and you can view them online.  We are starting advair- 1 puff twice daily. Ok to still use your albuterol as needed.

## 2015-06-07 NOTE — Progress Notes (Signed)
Pre visit review using our clinic review tool, if applicable. No additional management support is needed unless otherwise documented below in the visit note. 

## 2015-06-07 NOTE — Assessment & Plan Note (Signed)
Has been taking lasix for years. Check renal function and electrolytes today. The patient indicates understanding of these issues and agrees with the plan.

## 2015-06-07 NOTE — Assessment & Plan Note (Signed)
Deteriorated. Lungs clear on exam, recent CXR reassuring but becoming more symptomatic. No red flag symptoms.  She agrees to trial of daily inhaled steroid- eRx sent. Call or return to clinic prn if these symptoms worsen or fail to improve as anticipated. Refer to pulm if symptoms persist or worsen. The patient indicates understanding of these issues and agrees with the plan.

## 2015-06-07 NOTE — Progress Notes (Signed)
Subjective:   Patient ID: Kayla Alvarez, female    DOB: [birth year], 81 y.o.   MRN: [id number]  Kayla Alvarez is a pleasant 81 y.o. year old female who presents to clinic today with [place]  on 06/07/2015  HPI: Here to establish care from Dr. Sherren Mocha. Notes reviewed in Epic.  Last saw Dr. Sherren Mocha on 09/16/14.  Asthma- "I wheeze when I'm sick."  Uses as needed albuterol every week or so.  Does cough "all the time."   Does occasionally get shortness of breath.  No hemoptysis, night sweats or weight loss.  Non smoker. Neg CXR 09/16/14.  HLD- takes zocor 20 mg daily.  Denies myalgias.  Lab Results  Component Value Date   CHOL 208* 09/15/2013   HDL 54.40 09/15/2013   LDLCALC 111* 09/12/2012   LDLDIRECT 145.8 09/15/2013   TRIG 113.0 09/15/2013   CHOLHDL 4 09/15/2013   Lab Results  Component Value Date   ALT 20 09/15/2013   AST 17 09/15/2013   ALKPHOS 54 09/15/2013   BILITOT 0.6 09/15/2013    Also takes Lasix 20 mg daily due to peripheral edema and venous insufficieny - left > right. Has been doing this for "decades."  Lab Results  Component Value Date   CREATININE 0.9 09/16/2014   Current Outpatient Prescriptions on File Prior to Visit  Medication Sig Dispense Refill  . albuterol (PROVENTIL HFA;VENTOLIN HFA) 108 (90 BASE) MCG/ACT inhaler 2 puffs 3 times daily 1 Inhaler 0  . furosemide (LASIX) 20 MG tablet Take 1 tablet (20 mg total) by mouth daily. 100 tablet 3  . simvastatin (ZOCOR) 20 MG tablet Take 1 tablet (20 mg total) by mouth at bedtime. 100 tablet 3   No current facility-administered medications on file prior to visit.    No Known Allergies  Past Medical History  Diagnosis Date  . ALLERGIC RHINITIS 06/05/2007  . BREAST LUMP 05/19/2010  . CONTACT DERMATITIS&OTH ECZEMA DUE OTH [place] AGENT 02/02/2010  . Edema 07/19/2009  . HERPES ZOSTER 11/28/2009  . HYPERLIPIDEMIA 06/05/2007  . PULMONARY NODULE, LEFT UPPER LOBE 07/26/2009  . RENAL INSUFFICIENCY 06/05/2007  . UTI  09/03/2008  . VAGINITIS, ATROPHIC 06/26/2007  . Glaucoma   . Venous insufficiency     Past Surgical History  Procedure Laterality Date  . Hemorrhoid surgery    . Eye surgery    . Tooth extraction    . Cesarean section      Family History  Problem Relation Age of Onset  . Hypertension Mother   . Stroke Mother   . Heart disease Father   . Hypertension Father   . Breast cancer Sister   . Hypertension Sister     History   Social History  . Marital Status: Married    Spouse Name: N/A  . Number of Children: N/A  . Years of Education: N/A   Occupational History  . Not on file.   Social History Main Topics  . Smoking status: Former Research scientist (life sciences)  . Smokeless tobacco: Never Used  . Alcohol Use: No  . Drug Use: No  . Sexual Activity: No     Comment: 20+ years   Other Topics Concern  . Not on file   Social History Narrative   The PMH, PSH, Social History, Family History, Medications, and allergies have been reviewed in [facility], and have been updated if relevant.      Review of Systems  Constitutional: Negative.   HENT: Negative.   Eyes: Negative.   Respiratory:  Positive for cough, shortness of breath and wheezing. Negative for apnea, chest tightness and stridor.   Cardiovascular: Negative.   Gastrointestinal: Negative.   Endocrine: Negative.   Genitourinary: Negative.   Musculoskeletal: Negative.   Skin: Negative.   Allergic/Immunologic: Negative.   Neurological: Negative.   Hematological: Negative.   Psychiatric/Behavioral: Negative.   All other systems reviewed and are negative.      Objective:    BP 142/82 mmHg  Pulse 66  Temp(Src) 97.8 F (36.6 C) (Oral)  Ht 5' (1.524 m)  Wt 165 lb 4 oz (74.957 kg)  BMI 32.27 kg/m2  SpO2 97%   Physical Exam  Constitutional: She is oriented to person, place, and time. She appears well-developed and well-nourished. No distress.  HENT:  Head: Normocephalic.  Eyes: Conjunctivae are normal.  Neck: Normal range of  motion.  Cardiovascular: Normal rate and regular rhythm.   Pulmonary/Chest: Effort normal and breath sounds normal. No respiratory distress. She has no wheezes. She exhibits no tenderness.  Abdominal: Soft.  Musculoskeletal:  Trace edema  Neurological: She is alert and oriented to person, place, and time. No cranial nerve deficit.  Skin: Skin is warm and dry.  Psychiatric: She has a normal mood and affect. Her behavior is normal. Judgment and thought content normal.  Nursing note and vitals reviewed.         Assessment & Plan:   Hyperlipidemia  Edema  Allergic rhinitis, unspecified allergic rhinitis type No Follow-up on file.

## 2015-06-07 NOTE — Assessment & Plan Note (Signed)
Due for labs today. Continue current dose of zocor. Orders Placed This Encounter  Procedures  . Lipid panel  . Comprehensive metabolic panel  . CBC with Differential/Platelet  . TSH

## 2015-06-09 ENCOUNTER — Encounter: Payer: Self-pay | Admitting: *Deleted

## 2015-08-19 ENCOUNTER — Other Ambulatory Visit: Payer: Self-pay

## 2015-08-19 DIAGNOSIS — Z1231 Encounter for screening mammogram for malignant neoplasm of breast: Secondary | ICD-10-CM

## 2015-09-22 ENCOUNTER — Ambulatory Visit (INDEPENDENT_AMBULATORY_CARE_PROVIDER_SITE_OTHER): Payer: Medicare HMO | Admitting: Family Medicine

## 2015-09-22 ENCOUNTER — Encounter: Payer: Self-pay | Admitting: Family Medicine

## 2015-09-22 VITALS — BP 124/66 | HR 76 | Temp 97.7°F | Ht 59.75 in | Wt 165.8 lb

## 2015-09-22 DIAGNOSIS — E785 Hyperlipidemia, unspecified: Secondary | ICD-10-CM

## 2015-09-22 DIAGNOSIS — Z0000 Encounter for general adult medical examination without abnormal findings: Secondary | ICD-10-CM | POA: Diagnosis not present

## 2015-09-22 DIAGNOSIS — J4520 Mild intermittent asthma, uncomplicated: Secondary | ICD-10-CM | POA: Diagnosis not present

## 2015-09-22 DIAGNOSIS — R609 Edema, unspecified: Secondary | ICD-10-CM | POA: Diagnosis not present

## 2015-09-22 NOTE — Assessment & Plan Note (Signed)
She would like trial off zocor.  Advised to take through the holidays (just had it refilled)- she may try to stop it around 10/2015 and call me 2 months after stopping it for labs.

## 2015-09-22 NOTE — Progress Notes (Addendum)
Subjective:   Patient ID: Kayla Alvarez, female    DOB: 1934-06-22, 81 y.o.   MRN: [id number]  Kayla Alvarez is a pleasant 81 y.o. year old female who presents to clinic today with Annual Exam  and follow up of chronic medical conditions on 09/22/2015  HPI:  She established care with me in 05/2015.  I have personally reviewed the Medicare Annual Wellness questionnaire and have noted 1. The patient's medical and social history 2. Their use of alcohol, tobacco or illicit drugs 3. Their current medications and supplements 4. The patient's functional ability including ADL's, fall risks, home safety risks and hearing or visual             impairment. 5. Diet and physical activities 6. Evidence for depression or mood disorders  End of life wishes discussed and updated in Social History.  The roster of all physicians providing medical care to patient - is listed in the CareTeams section of the chart.  Pneumovax 06/16/07 TD 07/19/09 Derm skin survery 07/2015- Dr. Ubaldo Glassing Mammogram 09/21/14- has one scheduled for 09/27/15 Colonoscopy 9/29/08[facility] Eye exam 04/2015- Dr. Herbert Deaner     Asthma- "I wheeze when I'm sick." Uses as needed albuterol every week or so.. Non smoker. Did not pick up rx for Advair because it was too costly. Neg CXR 09/16/14.  HLD- takes zocor 20 mg daily. Having more muscle aches. Wants to know if she can stop taking.  Lab Results  Component Value Date   CHOL 157 06/07/2015   HDL 50.70 06/07/2015   LDLCALC 91 06/07/2015   LDLDIRECT 145.8 09/15/2013   TRIG 78.0 06/07/2015   CHOLHDL 3 06/07/2015   Lab Results  Component Value Date   ALT 15 06/07/2015   AST 17 06/07/2015   ALKPHOS 60 06/07/2015   BILITOT 0.5 06/07/2015    Also takes Lasix 20 mg daily due to peripheral edema and venous insufficieny - left > right. Has been doing this for "decades."  Lab Results  Component Value Date   CREATININE 0.89 06/07/2015    Current Outpatient Prescriptions  on File Prior to Visit  Medication Sig Dispense Refill  . Fluticasone-Salmeterol (ADVAIR DISKUS) 100-50 MCG/DOSE AEPB Inhale 1 puff into the lungs 2 (two) times daily. 60 each 0  . furosemide (LASIX) 20 MG tablet Take 1 tablet (20 mg total) by mouth daily. 100 tablet 3  . simvastatin (ZOCOR) 20 MG tablet Take 1 tablet (20 mg total) by mouth at bedtime. 100 tablet 3   No current facility-administered medications on file prior to visit.    No Known Allergies  Past Medical History  Diagnosis Date  . ALLERGIC RHINITIS 06/05/2007  . BREAST LUMP 05/19/2010  . CONTACT DERMATITIS&OTH ECZEMA DUE OTH [place] AGENT 02/02/2010  . Edema 07/19/2009  . HERPES ZOSTER 11/28/2009  . HYPERLIPIDEMIA 06/05/2007  . PULMONARY NODULE, LEFT UPPER LOBE 07/26/2009  . RENAL INSUFFICIENCY 06/05/2007  . UTI 09/03/2008  . VAGINITIS, ATROPHIC 06/26/2007  . Glaucoma   . Venous insufficiency     Past Surgical History  Procedure Laterality Date  . Hemorrhoid surgery    . Eye surgery    . Tooth extraction    . Cesarean section      Family History  Problem Relation Age of Onset  . Hypertension Mother   . Stroke Mother   . Heart disease Father   . Hypertension Father   . Breast cancer Sister   . Hypertension Sister     Social History  Social History  . Marital Status: Married    Spouse Name: N/A  . Number of Children: N/A  . Years of Education: N/A   Occupational History  . Not on file.   Social History Main Topics  . Smoking status: Former Research scientist (life sciences)  . Smokeless tobacco: Never Used  . Alcohol Use: No  . Drug Use: No  . Sexual Activity: No     Comment: 20+ years   Other Topics Concern  . Not on file   Social History Narrative   Desires CPR   The PMH, PSH, Social History, Family History, Medications, and allergies have been reviewed in [facility], and have been updated if relevant.  Review of Systems  Constitutional: Negative.   Eyes: Negative.   Respiratory: Negative.   Cardiovascular: Negative.     Gastrointestinal: Negative.   Endocrine: Negative.   Genitourinary: Negative.   Musculoskeletal: Positive for myalgias.  Allergic/Immunologic: Negative.   Neurological: Negative.   Hematological: Negative.   Psychiatric/Behavioral: Negative.   All other systems reviewed and are negative.      Objective:    BP 124/66 mmHg  Pulse 76  Temp(Src) 97.7 F (36.5 C) (Oral)  Ht 4' 11.75" (1.518 m)  Wt 165 lb 12 oz (75.184 kg)  BMI 32.63 kg/m2  SpO2 97%   Physical Exam  General:  Well-developed,well-nourished,in no acute distress; alert,appropriate and cooperative throughout examination Head:  normocephalic and atraumatic.   Eyes:  vision grossly intact, pupils equal, pupils round, and pupils reactive to light.   Ears:  R ear normal and L ear normal.   Nose:  no external deformity.   Mouth:  good dentition.   Neck:  No deformities, masses, or tenderness noted. Breasts:  No mass, nodules, thickening, tenderness, bulging, retraction, inflamation, nipple discharge or skin changes noted.   Lungs:  Normal respiratory effort, chest expands symmetrically. Lungs are clear to auscultation, no crackles or wheezes. Heart:  Normal rate and regular rhythm. S1 and S2 normal without gallop, murmur, click, rub or other extra sounds. Abdomen:  Bowel sounds positive,abdomen soft and non-tender without masses, organomegaly or hernias noted. Msk:  No deformity or scoliosis noted of thoracic or lumbar spine.   Extremities:  1+ edema left LE (baseline). Neurologic:  alert & oriented X3 and gait normal.   Skin:  Intact without suspicious lesions or rashes Cervical Nodes:  No lymphadenopathy noted Axillary Nodes:  No palpable lymphadenopathy Psych:  Cognition and judgment appear intact. Alert and cooperative with normal attention span and concentration. No apparent delusions, illusions, hallucinations        Assessment & Plan:   Asthma, chronic, mild intermittent,  uncomplicated  Hyperlipidemia  Medicare annual wellness visit, subsequent  Edema, unspecified type No Follow-up on file.

## 2015-09-22 NOTE — Addendum Note (Signed)
Addended by: Lucille Passy on: 09/22/2015 12:37 PM   Modules accepted: Miquel Dunn

## 2015-09-22 NOTE — Patient Instructions (Signed)
Great to see you. Happy Holidays!  If you do stop your cholesterol medication, please call me 2 months after stopping it to repeat labs.

## 2015-09-22 NOTE — Assessment & Plan Note (Signed)
The patients weight, height, BMI and visual acuity have been recorded in the chart.  Cognitive function assessed.   I have made referrals, counseling and provided education to the patient based review of the above and I have provided the pt with a written personalized care plan for preventive services.  Declined prevnar 13 and flu vaccinations.

## 2015-09-22 NOTE — Assessment & Plan Note (Signed)
Did not start inhaled steroid. She feels her symptoms are controlled.

## 2015-09-22 NOTE — Assessment & Plan Note (Signed)
Well controlled with lasix. Lab Results  Component Value Date   CREATININE 0.89 06/07/2015

## 2015-09-22 NOTE — Progress Notes (Signed)
Pre visit review using our clinic review tool, if applicable. No additional management support is needed unless otherwise documented below in the visit note. 

## 2015-09-27 ENCOUNTER — Ambulatory Visit
Admission: RE | Admit: 2015-09-27 | Discharge: 2015-09-27 | Disposition: A | Discharge status: Home / Self Care | Payer: Medicare HMO | Source: Ambulatory Visit

## 2015-09-27 DIAGNOSIS — Z1231 Encounter for screening mammogram for malignant neoplasm of breast: Secondary | ICD-10-CM

## 2015-10-13 DIAGNOSIS — H35032 Hypertensive retinopathy, left eye: Secondary | ICD-10-CM | POA: Diagnosis not present

## 2015-10-13 DIAGNOSIS — Z961 Presence of intraocular lens: Secondary | ICD-10-CM | POA: Diagnosis not present

## 2015-10-13 DIAGNOSIS — H538 Other visual disturbances: Secondary | ICD-10-CM | POA: Diagnosis not present

## 2015-10-13 DIAGNOSIS — H3562 Retinal hemorrhage, left eye: Secondary | ICD-10-CM | POA: Diagnosis not present

## 2015-10-13 DIAGNOSIS — H35031 Hypertensive retinopathy, right eye: Secondary | ICD-10-CM | POA: Diagnosis not present

## 2015-10-13 DIAGNOSIS — H3561 Retinal hemorrhage, right eye: Secondary | ICD-10-CM | POA: Diagnosis not present

## 2015-10-13 DIAGNOSIS — H402231 Chronic angle-closure glaucoma, bilateral, mild stage: Secondary | ICD-10-CM | POA: Diagnosis not present

## 2015-10-13 DIAGNOSIS — H35371 Puckering of macula, right eye: Secondary | ICD-10-CM | POA: Diagnosis not present

## 2015-10-14 ENCOUNTER — Ambulatory Visit (INDEPENDENT_AMBULATORY_CARE_PROVIDER_SITE_OTHER): Payer: Medicare HMO | Admitting: Family Medicine

## 2015-10-14 ENCOUNTER — Encounter: Payer: Self-pay | Admitting: Family Medicine

## 2015-10-14 VITALS — BP 148/78 | HR 76 | Temp 98.0°F | Wt 164.8 lb

## 2015-10-14 DIAGNOSIS — R030 Elevated blood-pressure reading, without diagnosis of hypertension: Secondary | ICD-10-CM | POA: Diagnosis not present

## 2015-10-14 DIAGNOSIS — E785 Hyperlipidemia, unspecified: Secondary | ICD-10-CM

## 2015-10-14 DIAGNOSIS — IMO0001 Reserved for inherently not codable concepts without codable children: Secondary | ICD-10-CM | POA: Insufficient documentation

## 2015-10-14 DIAGNOSIS — H3563 Retinal hemorrhage, bilateral: Secondary | ICD-10-CM | POA: Diagnosis not present

## 2015-10-14 HISTORY — DX: Retinal hemorrhage, bilateral: H35.63

## 2015-10-14 LAB — COMPREHENSIVE METABOLIC PANEL
ALT: 18 U/L (ref 0–35)
AST: 18 U/L (ref 0–37)
Albumin: 3.8 g/dL (ref 3.5–5.2)
Alkaline Phosphatase: 58 U/L (ref 39–117)
BUN: 10 mg/dL (ref 6–23)
CHLORIDE: 105 meq/L (ref 96–112)
CO2: 33 mEq/L — ABNORMAL HIGH (ref 19–32)
CREATININE: 0.83 mg/dL (ref 0.40–1.20)
Calcium: 9.2 mg/dL (ref 8.4–10.5)
GFR: 70.48 mL/min (ref >60.00)
Glucose, Bld: 137 mg/dL — ABNORMAL HIGH (ref 70–99)
POTASSIUM: 4.2 meq/L (ref 3.5–5.1)
Sodium: 141 mEq/L (ref 135–145)
Total Bilirubin: 0.4 mg/dL (ref 0.2–1.2)
Total Protein: 6.3 g/dL (ref 6.0–8.3)

## 2015-10-14 LAB — TSH: TSH: 1.49 u[IU]/mL (ref 0.35–4.50)

## 2015-10-14 LAB — HEMOGLOBIN A1C: Hgb A1c MFr Bld: 6.5 % (ref 4.6–6.5)

## 2015-10-14 MED ORDER — HYDROCHLOROTHIAZIDE 12.5 MG PO CAPS: 12.5000 mg | ORAL_CAPSULE | Freq: Every day | ORAL | Status: DC

## 2015-10-14 NOTE — Progress Notes (Signed)
Pre visit review using our clinic review tool, if applicable. No additional management support is needed unless otherwise documented below in the visit note. 

## 2015-10-14 NOTE — Assessment & Plan Note (Signed)
New- improved here compared to optho- 148/78, but is elevated compared to previous readings. I suspect this is because she has been forgetting doses of lasix.  Advised to take this daily- has never taken potassium with it even when taking it daily.  She does eat potassium rich foods. Check BP at home twice daily- she will start HCTZ 12.5 mg daily ONLY if consistent readings above 150/90. Recheck labs today as well.  Orders Placed This Encounter  Procedures  . Comprehensive metabolic panel  . TSH  . Hemoglobin A1c

## 2015-10-14 NOTE — Patient Instructions (Addendum)
Great to see you. I will call you with your lab results.   Please check your blood pressure twice daily and call me on Monday. Take your Lasix daily.  If over the weekend, your blood pressure increased above 150/90 consistently, start taking the blood pressure medication at your pharmacy- HCTZ 12.5 mg daily.

## 2015-10-14 NOTE — Assessment & Plan Note (Signed)
New- being followed closely by optho. Has follow up scheduled.

## 2015-10-14 NOTE — Progress Notes (Signed)
Subjective:   Patient ID: Kayla Alvarez, female    DOB: 02-09-1934, 81 y.o.   MRN: [id number]  Kayla Alvarez is a pleasant 81 y.o. year old female who presents to clinic today with elevated bp  on 10/14/2015  HPI:  Saw Dr. Monna Fam yesterday, 12/14. Dr. Herbert Deaner advised that she follow up with me ASAP for elevated blood pressures.  On eye exam, new bilateral retinal hemorrhages- note reviewed. Worsening hypertensive retinopathy. Checked her BP at optho office and BP was 187/87.  Has been normotensive as recently as one month ago in our office. Has been checking it at home and running 140s/70s. No HA.  Has had blurred vision.  Does have family h/o HTN. Does take Lasix 20 mg daily for swelling but has been forgetting to take it.  Lab Results  Component Value Date   CREATININE 0.89 06/07/2015   Lab Results  Component Value Date   HGBA1C 6.3 12/09/2012   Lab Results  Component Value Date   NA 142 06/07/2015   K 4.3 06/07/2015   CL 105 06/07/2015   CO2 30 06/07/2015     BP Readings from Last 3 Encounters:  10/14/15 148/78  09/22/15 124/66  06/07/15 142/82   Current Outpatient Prescriptions on File Prior to Visit  Medication Sig Dispense Refill  . Fluticasone-Salmeterol (ADVAIR DISKUS) 100-50 MCG/DOSE AEPB Inhale 1 puff into the lungs 2 (two) times daily. 60 each 0  . furosemide (LASIX) 20 MG tablet Take 1 tablet (20 mg total) by mouth daily. 100 tablet 3  . simvastatin (ZOCOR) 20 MG tablet Take 1 tablet (20 mg total) by mouth at bedtime. 100 tablet 3   No current facility-administered medications on file prior to visit.    No Known Allergies  Past Medical History  Diagnosis Date  . ALLERGIC RHINITIS 06/05/2007  . BREAST LUMP 05/19/2010  . CONTACT DERMATITIS&OTH ECZEMA DUE OTH [place] AGENT 02/02/2010  . Edema 07/19/2009  . HERPES ZOSTER 11/28/2009  . HYPERLIPIDEMIA 06/05/2007  . PULMONARY NODULE, LEFT UPPER LOBE 07/26/2009  . RENAL INSUFFICIENCY 06/05/2007  . UTI  09/03/2008  . VAGINITIS, ATROPHIC 06/26/2007  . Glaucoma   . Venous insufficiency     Past Surgical History  Procedure Laterality Date  . Hemorrhoid surgery    . Eye surgery    . Tooth extraction    . Cesarean section      Family History  Problem Relation Age of Onset  . Hypertension Mother   . Stroke Mother   . Heart disease Father   . Hypertension Father   . Breast cancer Sister   . Hypertension Sister     Social History   Social History  . Marital Status: Married    Spouse Name: N/A  . Number of Children: N/A  . Years of Education: N/A   Occupational History  . Not on file.   Social History Main Topics  . Smoking status: Former Research scientist (life sciences)  . Smokeless tobacco: Never Used  . Alcohol Use: No  . Drug Use: No  . Sexual Activity: No     Comment: 20+ years   Other Topics Concern  . Not on file   Social History Narrative   Desires CPR and life support if not futile   The PMH, PSH, Social History, Family History, Medications, and allergies have been reviewed in [facility], and have been updated if relevant.   Review of Systems  Constitutional: Negative.   Eyes: Positive for visual disturbance. Negative for photophobia.  Cardiovascular: Negative.   Musculoskeletal: Negative.   Skin: Negative.   Neurological: Negative.   Hematological: Negative.   Psychiatric/Behavioral: Negative.        Objective:    BP 148/78 mmHg  Pulse 76  Temp(Src) 98 F (36.7 C) (Oral)  Wt 164 lb 12 oz (74.73 kg)  SpO2 96%   Physical Exam  Constitutional: She is oriented to person, place, and time. She appears well-developed and well-nourished. No distress.  HENT:  Head: Normocephalic.  Eyes: Conjunctivae are normal.  Cardiovascular: Normal rate and regular rhythm.   Pulmonary/Chest: Effort normal.  Musculoskeletal: Normal range of motion. She exhibits no edema.  Neurological: She is alert and oriented to person, place, and time. No cranial nerve deficit.  Skin: Skin is warm and  dry. She is not diaphoretic.  Psychiatric: She has a normal mood and affect. Her behavior is normal. Judgment and thought content normal.  Nursing note and vitals reviewed.         Assessment & Plan:   Retinal hemorrhage of both eyes No Follow-up on file.

## 2015-10-19 ENCOUNTER — Telehealth: Payer: Self-pay | Admitting: Family Medicine

## 2015-10-19 NOTE — Telephone Encounter (Signed)
Pt called, returning [facility] phone call. She can be reached at her cell phone [phone].

## 2015-10-19 NOTE — Telephone Encounter (Signed)
Called patient back with lab results as instructed and verbalized understanding.

## 2015-11-08 ENCOUNTER — Ambulatory Visit: Payer: Medicare HMO | Admitting: Family Medicine

## 2015-11-11 ENCOUNTER — Ambulatory Visit (INDEPENDENT_AMBULATORY_CARE_PROVIDER_SITE_OTHER): Payer: Medicare HMO | Admitting: Family Medicine

## 2015-11-11 ENCOUNTER — Encounter: Payer: Self-pay | Admitting: Family Medicine

## 2015-11-11 VITALS — BP 122/68 | HR 79 | Temp 98.5°F | Wt 163.0 lb

## 2015-11-11 DIAGNOSIS — E119 Type 2 diabetes mellitus without complications: Secondary | ICD-10-CM

## 2015-11-11 DIAGNOSIS — I10 Essential (primary) hypertension: Secondary | ICD-10-CM | POA: Diagnosis not present

## 2015-11-11 NOTE — Assessment & Plan Note (Signed)
Well controlled.  No changes made. 

## 2015-11-11 NOTE — Patient Instructions (Signed)
Good to see you. Please come see me in 3 months.

## 2015-11-11 NOTE — Progress Notes (Signed)
Subjective:   Patient ID: Kayla Alvarez, female    DOB: [birth year], 82 y.o.   MRN: [id number]  Kayla Alvarez is a pleasant 82 y.o. year old female who presents to clinic today with Follow-up  on 11/11/2015  HPI:  New onset diabetes- a1c 6.5 this month. Admits to eating a lot of pastas and sweets. Denies increased thirst or urination.  Lab Results  Component Value Date   HGBA1C 6.5 10/14/2015   HTN- BP now well controlled with taking her lasix regularly. Current Outpatient Prescriptions on File Prior to Visit  Medication Sig Dispense Refill  . furosemide (LASIX) 20 MG tablet Take 1 tablet (20 mg total) by mouth daily. 100 tablet 3  . hydrochlorothiazide (MICROZIDE) 12.5 MG capsule Take 1 capsule (12.5 mg total) by mouth daily. 30 capsule 1  . simvastatin (ZOCOR) 20 MG tablet Take 1 tablet (20 mg total) by mouth at bedtime. 100 tablet 3   No current facility-administered medications on file prior to visit.    No Known Allergies  Past Medical History  Diagnosis Date  . ALLERGIC RHINITIS 06/05/2007  . BREAST LUMP 05/19/2010  . CONTACT DERMATITIS&OTH ECZEMA DUE OTH [place] AGENT 02/02/2010  . Edema 07/19/2009  . HERPES ZOSTER 11/28/2009  . HYPERLIPIDEMIA 06/05/2007  . PULMONARY NODULE, LEFT UPPER LOBE 07/26/2009  . RENAL INSUFFICIENCY 06/05/2007  . UTI 09/03/2008  . VAGINITIS, ATROPHIC 06/26/2007  . Glaucoma   . Venous insufficiency     Past Surgical History  Procedure Laterality Date  . Hemorrhoid surgery    . Eye surgery    . Tooth extraction    . Cesarean section      Family History  Problem Relation Age of Onset  . Hypertension Mother   . Stroke Mother   . Heart disease Father   . Hypertension Father   . Breast cancer Sister   . Hypertension Sister     Social History   Social History  . Marital Status: Married    Spouse Name: N/A  . Number of Children: N/A  . Years of Education: N/A   Occupational History  . Not on file.   Social History Main Topics  . Smoking  status: Former Research scientist (life sciences)  . Smokeless tobacco: Never Used  . Alcohol Use: No  . Drug Use: No  . Sexual Activity: No     Comment: 20+ years   Other Topics Concern  . Not on file   Social History Narrative   Desires CPR and life support if not futile   The PMH, PSH, Social History, Family History, Medications, and allergies have been reviewed in [facility], and have been updated if relevant.   Review of Systems  Constitutional: Negative.   Respiratory: Negative.   Cardiovascular: Negative.   Genitourinary: Negative.   Musculoskeletal: Negative.   Skin: Negative.   Psychiatric/Behavioral: Negative.   All other systems reviewed and are negative.      Objective:    BP 122/68 mmHg  Pulse 79  Temp(Src) 98.5 F (36.9 C) (Oral)  Wt 163 lb (73.936 kg)  SpO2 96%   Physical Exam  Constitutional: She is oriented to person, place, and time. She appears well-developed and well-nourished. No distress.  HENT:  Head: Normocephalic.  Eyes: Conjunctivae are normal.  Cardiovascular: Normal rate and regular rhythm.   Pulmonary/Chest: Effort normal and breath sounds normal.  Musculoskeletal: She exhibits no edema.  Neurological: She is alert and oriented to person, place, and time. No cranial nerve deficit.  Skin:  Skin is warm and dry. She is not diaphoretic.  Psychiatric: She has a normal mood and affect. Her behavior is normal. Judgment and thought content normal.  Nursing note and vitals reviewed.         Assessment & Plan:   New onset type 2 diabetes mellitus ([place]) No Follow-up on file.

## 2015-11-11 NOTE — Assessment & Plan Note (Signed)
New- >15 minutes spent in face to face time with patient, >50% spent in counselling or coordination of care She is refusing rx and diabetic nutrition.  Since husband is a diabetic, says "i Know what to do." Will follow up in 3 months- given copy of eat right diet. If a1c remains elevated at that time, she does agree to starting metformin.

## 2015-11-15 DIAGNOSIS — H524 Presbyopia: Secondary | ICD-10-CM | POA: Diagnosis not present

## 2015-11-15 DIAGNOSIS — H04123 Dry eye syndrome of bilateral lacrimal glands: Secondary | ICD-10-CM | POA: Diagnosis not present

## 2015-11-15 DIAGNOSIS — L718 Other rosacea: Secondary | ICD-10-CM | POA: Diagnosis not present

## 2015-11-15 DIAGNOSIS — H402231 Chronic angle-closure glaucoma, bilateral, mild stage: Secondary | ICD-10-CM | POA: Diagnosis not present

## 2015-12-28 ENCOUNTER — Encounter: Payer: Self-pay | Admitting: Family Medicine

## 2015-12-28 ENCOUNTER — Ambulatory Visit (INDEPENDENT_AMBULATORY_CARE_PROVIDER_SITE_OTHER): Payer: Medicare HMO | Admitting: Family Medicine

## 2015-12-28 VITALS — BP 118/62 | HR 75 | Temp 98.0°F | Wt 155.5 lb

## 2015-12-28 DIAGNOSIS — R52 Pain, unspecified: Secondary | ICD-10-CM | POA: Diagnosis not present

## 2015-12-28 DIAGNOSIS — N259 Disorder resulting from impaired renal tubular function, unspecified: Secondary | ICD-10-CM | POA: Diagnosis not present

## 2015-12-28 DIAGNOSIS — J984 Other disorders of lung: Secondary | ICD-10-CM

## 2015-12-28 DIAGNOSIS — R601 Generalized edema: Secondary | ICD-10-CM

## 2015-12-28 DIAGNOSIS — E785 Hyperlipidemia, unspecified: Secondary | ICD-10-CM | POA: Diagnosis not present

## 2015-12-28 DIAGNOSIS — J45901 Unspecified asthma with (acute) exacerbation: Secondary | ICD-10-CM

## 2015-12-28 LAB — POCT INFLUENZA A/B
INFLUENZA B, POC: NEGATIVE
Influenza A, POC: NEGATIVE

## 2015-12-28 MED ORDER — AZITHROMYCIN 250 MG PO TABS: ORAL_TABLET | ORAL | Status: DC

## 2015-12-28 MED ORDER — FUROSEMIDE 20 MG PO TABS: 20.0000 mg | ORAL_TABLET | Freq: Every day | ORAL | Status: DC

## 2015-12-28 MED ORDER — HYDROCOD POLST-CPM POLST ER 10-8 MG/5ML PO SUER: 5.0000 mL | Freq: Two times a day (BID) | ORAL | Status: DC | PRN

## 2015-12-28 NOTE — Progress Notes (Signed)
Pre visit review using our clinic review tool, if applicable. No additional management support is needed unless otherwise documented below in the visit note. 

## 2015-12-28 NOTE — Progress Notes (Signed)
SUBJECTIVE:  Kayla Alvarez is a 82 y.o. female who complains of coryza, congestion, productive cough, myalgias and fever for 7 days. She denies a history of anorexia and chest pain and admits to a history of asthma. Patient denies smoke cigarettes.   Current Outpatient Prescriptions on File Prior to Visit  Medication Sig Dispense Refill  . furosemide (LASIX) 20 MG tablet Take 1 tablet (20 mg total) by mouth daily. 100 tablet 3  . hydrochlorothiazide (MICROZIDE) 12.5 MG capsule Take 1 capsule (12.5 mg total) by mouth daily. 30 capsule 1  . simvastatin (ZOCOR) 20 MG tablet Take 1 tablet (20 mg total) by mouth at bedtime. (Patient not taking: Reported on 12/28/2015) 100 tablet 3   No current facility-administered medications on file prior to visit.    No Known Allergies  Past Medical History  Diagnosis Date  . ALLERGIC RHINITIS 06/05/2007  . BREAST LUMP 05/19/2010  . CONTACT DERMATITIS&OTH ECZEMA DUE OTH [place] AGENT 02/02/2010  . Edema 07/19/2009  . HERPES ZOSTER 11/28/2009  . HYPERLIPIDEMIA 06/05/2007  . PULMONARY NODULE, LEFT UPPER LOBE 07/26/2009  . RENAL INSUFFICIENCY 06/05/2007  . UTI 09/03/2008  . VAGINITIS, ATROPHIC 06/26/2007  . Glaucoma   . Venous insufficiency     Past Surgical History  Procedure Laterality Date  . Hemorrhoid surgery    . Eye surgery    . Tooth extraction    . Cesarean section      Family History  Problem Relation Age of Onset  . Hypertension Mother   . Stroke Mother   . Heart disease Father   . Hypertension Father   . Breast cancer Sister   . Hypertension Sister     Social History   Social History  . Marital Status: Married    Spouse Name: N/A  . Number of Children: N/A  . Years of Education: N/A   Occupational History  . Not on file.   Social History Main Topics  . Smoking status: Former Research scientist (life sciences)  . Smokeless tobacco: Never Used  . Alcohol Use: No  . Drug Use: No  . Sexual Activity: No     Comment: 20+ years   Other Topics Concern  . Not on  file   Social History Narrative   Desires CPR and life support if not futile   The PMH, PSH, Social History, Family History, Medications, and allergies have been reviewed in [facility], and have been updated if relevant.  OBJECTIVE: BP 118/62 mmHg  Pulse 75  Temp(Src) 98 F (36.7 C) (Oral)  Wt 155 lb 8 oz (70.534 kg)  SpO2 96%  She appears well, vital signs are as noted. Ears normal.  Throat and pharynx normal.  Neck supple. No adenopathy in the neck. Nose is congested. Sinuses non tender.  Scattered rhonchi and wheezes  ASSESSMENT:  Probable post ILI bacterial bronchitis/pneumonia  PLAN: Zpack as directed. tussionex as needed for severe cough. Symptomatic therapy suggested: push fluids, rest and return office visit prn if symptoms persist or worsen.  Call or return to clinic prn if these symptoms worsen or fail to improve as anticipated.

## 2015-12-28 NOTE — Patient Instructions (Addendum)
Good to see you. Please take zpack as directed.  Drink plenty of fluids.  Keep me updated.  Tussionex as needed for severe cough.

## 2016-01-06 ENCOUNTER — Telehealth: Payer: Self-pay | Admitting: Family Medicine

## 2016-01-06 NOTE — Telephone Encounter (Signed)
Spoke to pt and advised per Dr Deborra Medina. States she will contact office back if s/s have not resolved by the end of next week.

## 2016-01-06 NOTE — Telephone Encounter (Signed)
Pt request call back. Seen last week and giving abx for flu. Pt finished abx last Friday and is not getting any better. Still coughing/and feeling nauseas. Feels week and cannot get strength back. Please advise  [facility]

## 2016-01-06 NOTE — Telephone Encounter (Signed)
Please call pt and get more information. Given zpack for post flu infection. Should still be in her system.  May need CXR if symptoms progressing but can take weeks for cough to resolve.

## 2016-01-28 ENCOUNTER — Other Ambulatory Visit: Payer: Self-pay | Admitting: Family Medicine

## 2016-01-28 DIAGNOSIS — E119 Type 2 diabetes mellitus without complications: Secondary | ICD-10-CM

## 2016-02-02 ENCOUNTER — Other Ambulatory Visit (INDEPENDENT_AMBULATORY_CARE_PROVIDER_SITE_OTHER): Payer: Medicare HMO

## 2016-02-02 DIAGNOSIS — E119 Type 2 diabetes mellitus without complications: Secondary | ICD-10-CM

## 2016-02-02 LAB — LIPID PANEL
Cholesterol: 247 mg/dL — ABNORMAL HIGH (ref 0–200)
HDL: 47.5 mg/dL (ref >39.00)
LDL Cholesterol: 185 mg/dL — ABNORMAL HIGH (ref 0–99)
NonHDL: 199.38
Total CHOL/HDL Ratio: 5
Triglycerides: 71 mg/dL (ref 0.0–149.0)
VLDL: 14.2 mg/dL (ref 0.0–40.0)

## 2016-02-02 LAB — COMPREHENSIVE METABOLIC PANEL
ALK PHOS: 58 U/L (ref 39–117)
ALT: 21 U/L (ref 0–35)
AST: 20 U/L (ref 0–37)
Albumin: 4 g/dL (ref 3.5–5.2)
BUN: 17 mg/dL (ref 6–23)
CO2: 28 mEq/L (ref 19–32)
Calcium: 9.6 mg/dL (ref 8.4–10.5)
Chloride: 106 mEq/L (ref 96–112)
Creatinine, Ser: 0.85 mg/dL (ref 0.40–1.20)
GFR: 68.52 mL/min (ref >60.00)
GLUCOSE: 103 mg/dL — AB (ref 70–99)
POTASSIUM: 4.2 meq/L (ref 3.5–5.1)
SODIUM: 139 meq/L (ref 135–145)
Total Bilirubin: 0.6 mg/dL (ref 0.2–1.2)
Total Protein: 6.6 g/dL (ref 6.0–8.3)

## 2016-02-02 LAB — MICROALBUMIN / CREATININE URINE RATIO
CREATININE, U: 142 mg/dL
MICROALB UR: 2 mg/dL — AB (ref 0.0–1.9)
Microalb Creat Ratio: 1.4 mg/g (ref 0.0–30.0)

## 2016-02-02 LAB — HEMOGLOBIN A1C: HEMOGLOBIN A1C: 6.1 % (ref 4.6–6.5)

## 2016-02-09 ENCOUNTER — Encounter: Payer: Self-pay | Admitting: Family Medicine

## 2016-02-09 ENCOUNTER — Ambulatory Visit (INDEPENDENT_AMBULATORY_CARE_PROVIDER_SITE_OTHER): Payer: Medicare HMO | Admitting: Family Medicine

## 2016-02-09 VITALS — BP 116/72 | HR 64 | Temp 97.8°F | Wt 154.5 lb

## 2016-02-09 DIAGNOSIS — E119 Type 2 diabetes mellitus without complications: Secondary | ICD-10-CM | POA: Diagnosis not present

## 2016-02-09 DIAGNOSIS — E785 Hyperlipidemia, unspecified: Secondary | ICD-10-CM

## 2016-02-09 DIAGNOSIS — I10 Essential (primary) hypertension: Secondary | ICD-10-CM

## 2016-02-09 NOTE — Assessment & Plan Note (Signed)
Well controlled.  NO changes made. 

## 2016-02-09 NOTE — Assessment & Plan Note (Signed)
Diet controlled! Congratulated her on her success. Follow up in 6 months.

## 2016-02-09 NOTE — Progress Notes (Signed)
Pre visit review using our clinic review tool, if applicable. No additional management support is needed unless otherwise documented below in the visit note. 

## 2016-02-09 NOTE — Progress Notes (Signed)
Subjective:   Patient ID: Kayla Alvarez, female    DOB: Nov 05, 1933, 82 y.o.   MRN: [id number]  Kayla Alvarez is a pleasant 82 y.o. year old female who presents to clinic today with Follow-up  on 02/09/2016  HPI:  New onset diabetes- a1c 6.5 in 09/2015. Admits to eating a lot of pastas and sweets and refused rx and diabetic teaching. Denies increased thirst or urination. a1c is now 6.1!  Lab Results  Component Value Date   HGBA1C 6.1 02/02/2016   HTN- BP now well controlled with taking her lasix regularly.  She did stop taking her zocor 20 mg daily - "to see if I still needed it." Lab Results  Component Value Date   CHOL 247* 02/02/2016   HDL 47.50 02/02/2016   LDLCALC 185* 02/02/2016   LDLDIRECT 145.8 09/15/2013   TRIG 71.0 02/02/2016   CHOLHDL 5 02/02/2016    Current Outpatient Prescriptions on File Prior to Visit  Medication Sig Dispense Refill  . furosemide (LASIX) 20 MG tablet Take 1 tablet (20 mg total) by mouth daily. 100 tablet 3  . simvastatin (ZOCOR) 20 MG tablet Take 1 tablet (20 mg total) by mouth at bedtime. (Patient not taking: Reported on 02/09/2016) 100 tablet 3   No current facility-administered medications on file prior to visit.    No Known Allergies  Past Medical History  Diagnosis Date  . ALLERGIC RHINITIS 06/05/2007  . BREAST LUMP 05/19/2010  . CONTACT DERMATITIS&OTH ECZEMA DUE OTH [place] AGENT 02/02/2010  . Edema 07/19/2009  . HERPES ZOSTER 11/28/2009  . HYPERLIPIDEMIA 06/05/2007  . PULMONARY NODULE, LEFT UPPER LOBE 07/26/2009  . RENAL INSUFFICIENCY 06/05/2007  . UTI 09/03/2008  . VAGINITIS, ATROPHIC 06/26/2007  . Glaucoma   . Venous insufficiency     Past Surgical History  Procedure Laterality Date  . Hemorrhoid surgery    . Eye surgery    . Tooth extraction    . Cesarean section      Family History  Problem Relation Age of Onset  . Hypertension Mother   . Stroke Mother   . Heart disease Father   . Hypertension Father   . Breast cancer  Sister   . Hypertension Sister     Social History   Social History  . Marital Status: Married    Spouse Name: N/A  . Number of Children: N/A  . Years of Education: N/A   Occupational History  . Not on file.   Social History Main Topics  . Smoking status: Former Research scientist (life sciences)  . Smokeless tobacco: Never Used  . Alcohol Use: No  . Drug Use: No  . Sexual Activity: No     Comment: 20+ years   Other Topics Concern  . Not on file   Social History Narrative   Desires CPR and life support if not futile   The PMH, PSH, Social History, Family History, Medications, and allergies have been reviewed in [facility], and have been updated if relevant.   Review of Systems  Constitutional: Negative.   Respiratory: Negative.   Cardiovascular: Negative.   Genitourinary: Negative.   Musculoskeletal: Negative.   Skin: Negative.   Psychiatric/Behavioral: Negative.   All other systems reviewed and are negative.      Objective:    BP 116/72 mmHg  Pulse 64  Temp(Src) 97.8 F (36.6 C) (Oral)  Wt 154 lb 8 oz (70.081 kg)  SpO2 98%  Wt Readings from Last 3 Encounters:  02/09/16 154 lb 8 oz (70.081 kg)  12/28/15 155 lb 8 oz (70.534 kg)  11/11/15 163 lb (73.936 kg)    Physical Exam  Constitutional: She is oriented to person, place, and time. She appears well-developed and well-nourished. No distress.  HENT:  Head: Normocephalic.  Eyes: Conjunctivae are normal.  Cardiovascular: Normal rate and regular rhythm.   Pulmonary/Chest: Effort normal and breath sounds normal.  Musculoskeletal: She exhibits no edema.  Neurological: She is alert and oriented to person, place, and time. No cranial nerve deficit.  Skin: Skin is warm and dry. She is not diaphoretic.  Psychiatric: She has a normal mood and affect. Her behavior is normal. Judgment and thought content normal.  Nursing note and vitals reviewed.         Assessment & Plan:   New onset type 2 diabetes mellitus  ([place])  Hyperlipidemia  Essential hypertension No Follow-up on file.

## 2016-02-09 NOTE — Assessment & Plan Note (Signed)
Deteriorated. She agrees to restart zocor.

## 2016-03-14 ENCOUNTER — Other Ambulatory Visit: Payer: Self-pay | Admitting: Family Medicine

## 2016-03-16 ENCOUNTER — Other Ambulatory Visit: Payer: Self-pay | Admitting: *Deleted

## 2016-03-16 DIAGNOSIS — J45901 Unspecified asthma with (acute) exacerbation: Secondary | ICD-10-CM

## 2016-03-16 DIAGNOSIS — N259 Disorder resulting from impaired renal tubular function, unspecified: Secondary | ICD-10-CM

## 2016-03-16 DIAGNOSIS — J984 Other disorders of lung: Secondary | ICD-10-CM

## 2016-03-16 DIAGNOSIS — E785 Hyperlipidemia, unspecified: Secondary | ICD-10-CM

## 2016-03-16 NOTE — Telephone Encounter (Signed)
Please verify with pt if she is taking this.

## 2016-03-16 NOTE — Telephone Encounter (Signed)
Fax received requesting medication refill. Pts chart indicates she is no longer taking medication. Last labs 01/2016. pls advise

## 2016-03-17 ENCOUNTER — Other Ambulatory Visit: Payer: Self-pay | Admitting: Family Medicine

## 2016-03-21 ENCOUNTER — Other Ambulatory Visit: Payer: Self-pay | Admitting: *Deleted

## 2016-03-21 DIAGNOSIS — E785 Hyperlipidemia, unspecified: Secondary | ICD-10-CM

## 2016-03-21 DIAGNOSIS — N259 Disorder resulting from impaired renal tubular function, unspecified: Secondary | ICD-10-CM

## 2016-03-21 DIAGNOSIS — J984 Other disorders of lung: Secondary | ICD-10-CM

## 2016-03-21 DIAGNOSIS — J45901 Unspecified asthma with (acute) exacerbation: Secondary | ICD-10-CM

## 2016-03-21 MED ORDER — SIMVASTATIN 20 MG PO TABS: 20.0000 mg | ORAL_TABLET | Freq: Every day | ORAL | Status: DC

## 2016-05-11 DIAGNOSIS — Z961 Presence of intraocular lens: Secondary | ICD-10-CM | POA: Diagnosis not present

## 2016-05-11 DIAGNOSIS — H402211 Chronic angle-closure glaucoma, right eye, mild stage: Secondary | ICD-10-CM | POA: Diagnosis not present

## 2016-05-11 DIAGNOSIS — H35373 Puckering of macula, bilateral: Secondary | ICD-10-CM | POA: Diagnosis not present

## 2016-05-11 DIAGNOSIS — H402221 Chronic angle-closure glaucoma, left eye, mild stage: Secondary | ICD-10-CM | POA: Diagnosis not present

## 2016-05-11 LAB — HM DIABETES EYE EXAM

## 2016-09-06 ENCOUNTER — Other Ambulatory Visit: Payer: Self-pay | Admitting: Family Medicine

## 2016-09-06 DIAGNOSIS — Z1231 Encounter for screening mammogram for malignant neoplasm of breast: Secondary | ICD-10-CM

## 2016-09-20 DIAGNOSIS — Z85828 Personal history of other malignant neoplasm of skin: Secondary | ICD-10-CM | POA: Diagnosis not present

## 2016-10-04 ENCOUNTER — Encounter: Payer: Self-pay | Admitting: Family Medicine

## 2016-10-04 ENCOUNTER — Ambulatory Visit (INDEPENDENT_AMBULATORY_CARE_PROVIDER_SITE_OTHER): Payer: Medicare HMO | Admitting: Family Medicine

## 2016-10-04 VITALS — BP 142/68 | HR 68 | Temp 97.8°F | Ht 60.0 in | Wt 159.5 lb

## 2016-10-04 DIAGNOSIS — Z01419 Encounter for gynecological examination (general) (routine) without abnormal findings: Secondary | ICD-10-CM

## 2016-10-04 DIAGNOSIS — E785 Hyperlipidemia, unspecified: Secondary | ICD-10-CM | POA: Diagnosis not present

## 2016-10-04 DIAGNOSIS — E119 Type 2 diabetes mellitus without complications: Secondary | ICD-10-CM | POA: Diagnosis not present

## 2016-10-04 DIAGNOSIS — Z0000 Encounter for general adult medical examination without abnormal findings: Secondary | ICD-10-CM

## 2016-10-04 DIAGNOSIS — N259 Disorder resulting from impaired renal tubular function, unspecified: Secondary | ICD-10-CM

## 2016-10-04 DIAGNOSIS — I10 Essential (primary) hypertension: Secondary | ICD-10-CM

## 2016-10-04 DIAGNOSIS — J984 Other disorders of lung: Secondary | ICD-10-CM

## 2016-10-04 DIAGNOSIS — R601 Generalized edema: Secondary | ICD-10-CM

## 2016-10-04 LAB — CBC WITH DIFFERENTIAL/PLATELET
BASOS ABS: 0 10*3/uL (ref 0.0–0.1)
BASOS PCT: 0.6 % (ref 0.0–3.0)
Eosinophils Absolute: 0.2 10*3/uL (ref 0.0–0.7)
Eosinophils Relative: 3.4 % (ref 0.0–5.0)
HCT: 43.2 % (ref 36.0–46.0)
Hemoglobin: 14.4 g/dL (ref 12.0–15.0)
LYMPHS ABS: 2.6 10*3/uL (ref 0.7–4.0)
Lymphocytes Relative: 35.5 % (ref 12.0–46.0)
MCHC: 33.3 g/dL (ref 30.0–36.0)
MCV: 90 fl (ref 78.0–100.0)
MONOS PCT: 9.9 % (ref 3.0–12.0)
Monocytes Absolute: 0.7 10*3/uL (ref 0.1–1.0)
NEUTROS ABS: 3.6 10*3/uL (ref 1.4–7.7)
NEUTROS PCT: 50.6 % (ref 43.0–77.0)
PLATELETS: 256 10*3/uL (ref 150.0–400.0)
RBC: 4.8 Mil/uL (ref 3.87–5.11)
RDW: 13.3 % (ref 11.5–15.5)
WBC: 7.2 10*3/uL (ref 4.0–10.5)

## 2016-10-04 LAB — MICROALBUMIN / CREATININE URINE RATIO
Creatinine,U: 197.3 mg/dL
Microalb Creat Ratio: 0.7 mg/g (ref 0.0–30.0)
Microalb, Ur: 1.4 mg/dL (ref 0.0–1.9)

## 2016-10-04 LAB — COMPREHENSIVE METABOLIC PANEL
ALT: 21 U/L (ref 0–35)
AST: 20 U/L (ref 0–37)
Albumin: 4 g/dL (ref 3.5–5.2)
Alkaline Phosphatase: 59 U/L (ref 39–117)
BILIRUBIN TOTAL: 0.6 mg/dL (ref 0.2–1.2)
BUN: 13 mg/dL (ref 6–23)
CALCIUM: 9.4 mg/dL (ref 8.4–10.5)
CHLORIDE: 103 meq/L (ref 96–112)
CO2: 30 meq/L (ref 19–32)
Creatinine, Ser: 0.85 mg/dL (ref 0.40–1.20)
GFR: 68.4 mL/min (ref >60.00)
GLUCOSE: 120 mg/dL — AB (ref 70–99)
POTASSIUM: 4 meq/L (ref 3.5–5.1)
Sodium: 138 mEq/L (ref 135–145)
Total Protein: 6.6 g/dL (ref 6.0–8.3)

## 2016-10-04 LAB — LIPID PANEL
CHOL/HDL RATIO: 4
Cholesterol: 234 mg/dL — ABNORMAL HIGH (ref 0–200)
HDL: 57.7 mg/dL (ref >39.00)
LDL CALC: 160 mg/dL — AB (ref 0–99)
NONHDL: 176.76
TRIGLYCERIDES: 84 mg/dL (ref 0.0–149.0)
VLDL: 16.8 mg/dL (ref 0.0–40.0)

## 2016-10-04 LAB — TSH: TSH: 0.65 u[IU]/mL (ref 0.35–4.50)

## 2016-10-04 LAB — HEMOGLOBIN A1C: Hgb A1c MFr Bld: 6.3 % (ref 4.6–6.5)

## 2016-10-04 MED ORDER — SIMVASTATIN 20 MG PO TABS: 20.0000 mg | ORAL_TABLET | Freq: Every day | ORAL | 2 refills | Status: DC

## 2016-10-04 MED ORDER — FUROSEMIDE 20 MG PO TABS: 20.0000 mg | ORAL_TABLET | Freq: Every day | ORAL | 2 refills | Status: DC

## 2016-10-04 NOTE — Patient Instructions (Signed)
Great to see you. We will call you with your lab results and you can view them online.  Happy birthday and Happy Holidays!

## 2016-10-04 NOTE — Assessment & Plan Note (Signed)
Continue current dose of statin. Check labs today. 

## 2016-10-04 NOTE — Progress Notes (Signed)
Pre visit review using our clinic review tool, if applicable. No additional management support is needed unless otherwise documented below in the visit note. 

## 2016-10-04 NOTE — Assessment & Plan Note (Signed)
Reviewed preventive care protocols, scheduled due services, and updated immunizations Discussed nutrition, exercise, diet, and healthy lifestyle.  

## 2016-10-04 NOTE — Assessment & Plan Note (Signed)
The patients weight, height, BMI and visual acuity have been recorded in the chart.  Cognitive function assessed.   I have made referrals, counseling and provided education to the patient based review of the above and I have provided the pt with a written personalized care plan for preventive services.  Declines zostavax,prevnar and influenza vaccines.

## 2016-10-04 NOTE — Assessment & Plan Note (Signed)
Has been diet controlled. Due for labs today. 

## 2016-10-04 NOTE — Progress Notes (Signed)
Subjective:   Patient ID: Kayla Alvarez, female    DOB: 01/03/1934, 82 y.o.   MRN: [id number]  Kayla Alvarez is a pleasant 82 y.o. year old female who presents to clinic today with Annual Exam (Medicare)  CPX and follow up of chronic medical conditions on 10/04/2016  HPI:  I have personally reviewed the Medicare Annual Wellness questionnaire and have noted 1. The patient's medical and social history 2. Their use of alcohol, tobacco or illicit drugs 3. Their current medications and supplements 4. The patient's functional ability including ADL's, fall risks, home safety risks and hearing or visual             impairment. 5. Diet and physical activities 6. Evidence for depression or mood disorders   End of life wishes discussed and updated in Social History.  The roster of all physicians providing medical care to patient - is listed in the CareTeams section of the chart.  Pneumovax 06/16/07 Td 07/19/09 Mammogram 09/27/15- has one scheduled for 10/12/16 Colonoscopy 07/29/2007- [facility]   New onset diabetes- a1c 6.5 in 09/2015. Now diet controlled. Denies increased thirst or urination. Last a1c was 6.1.   Lab Results  Component Value Date   HGBA1C 6.1 02/02/2016   HLD- taking zocor 20 mg daily. Lab Results  Component Value Date   CHOL 247 (H) 02/02/2016   HDL 47.50 02/02/2016   LDLCALC 185 (H) 02/02/2016   LDLDIRECT 145.8 09/15/2013   TRIG 71.0 02/02/2016   CHOLHDL 5 02/02/2016   Lab Results  Component Value Date   ALT 21 02/02/2016   AST 20 02/02/2016   ALKPHOS 58 02/02/2016   BILITOT 0.6 02/02/2016     No current outpatient prescriptions on file prior to visit.   No current facility-administered medications on file prior to visit.     No Known Allergies  Past Medical History:  Diagnosis Date  . ALLERGIC RHINITIS 06/05/2007  . BREAST LUMP 05/19/2010  . CONTACT DERMATITIS&OTH ECZEMA DUE OTH [place] AGENT 02/02/2010  . Edema 07/19/2009  . Glaucoma   . HERPES ZOSTER  11/28/2009  . HYPERLIPIDEMIA 06/05/2007  . PULMONARY NODULE, LEFT UPPER LOBE 07/26/2009  . RENAL INSUFFICIENCY 06/05/2007  . UTI 09/03/2008  . VAGINITIS, ATROPHIC 06/26/2007  . Venous insufficiency     Past Surgical History:  Procedure Laterality Date  . CESAREAN SECTION    . EYE SURGERY    . HEMORRHOID SURGERY    . TOOTH EXTRACTION      Family History  Problem Relation Age of Onset  . Hypertension Mother   . Stroke Mother   . Heart disease Father   . Hypertension Father   . Breast cancer Sister   . Hypertension Sister     Social History   Social History  . Marital status: Married    Spouse name: N/A  . Number of children: N/A  . Years of education: N/A   Occupational History  . Not on file.   Social History Main Topics  . Smoking status: Former Research scientist (life sciences)  . Smokeless tobacco: Never Used  . Alcohol use No  . Drug use: No  . Sexual activity: No     Comment: 20+ years   Other Topics Concern  . Not on file   Social History Narrative   Desires CPR and life support if not futile     Review of Systems  Constitutional: Negative.   HENT: Negative.   Respiratory: Negative.   Cardiovascular: Negative.   Gastrointestinal: Negative.  Endocrine: Negative.   Genitourinary: Negative.   Musculoskeletal: Negative.   Allergic/Immunologic: Negative.   Neurological: Negative.   Hematological: Negative.   Psychiatric/Behavioral: Negative.   All other systems reviewed and are negative.      Objective:    BP (!) 142/68   Pulse 68   Temp 97.8 F (36.6 C) (Oral)   Ht 5' (1.524 m)   Wt 159 lb 8 oz (72.3 kg)   SpO2 97%   BMI 31.15 kg/m    Physical Exam  General:  Well-developed,well-nourished,in no acute distress; alert,appropriate and cooperative throughout examination Head:  normocephalic and atraumatic.   Eyes:  vision grossly intact, PERRL Ears:  R ear normal and L ear normal externally, TMs clear bilaterally Nose:  no external deformity.   Mouth:  good  dentition.   Neck:  No deformities, masses, or tenderness noted  Lungs:  Normal respiratory effort, chest expands symmetrically. Lungs are clear to auscultation, no crackles or wheezes. Heart:  Normal rate and regular rhythm. S1 and S2 normal without gallop, murmur, click, rub or other extra sounds. Abdomen:  Bowel sounds positive,abdomen soft and non-tender without masses, organomegaly or hernias noted. Msk:  No deformity or scoliosis noted of thoracic or lumbar spine.   Extremities:  No clubbing, cyanosis, edema, or deformity noted with normal full range of motion of all joints.   Neurologic:  alert & oriented X3 and gait normal.   Skin:  Intact without suspicious lesions or rashes Cervical Nodes:  No lymphadenopathy noted Axillary Nodes:  No palpable lymphadenopathy Psych:  Cognition and judgment appear intact. Alert and cooperative with normal attention span and concentration. No apparent delusions, illusions, hallucinations        Assessment & Plan:   New onset type 2 diabetes mellitus ([place])  Essential hypertension  Hyperlipidemia, unspecified hyperlipidemia type - Plan: simvastatin (ZOCOR) 20 MG tablet  Medicare annual wellness visit, subsequent  Well woman exam  Disorder resulting from impaired renal function - Plan: simvastatin (ZOCOR) 20 MG tablet, furosemide (LASIX) 20 MG tablet  Lung disease - Plan: simvastatin (ZOCOR) 20 MG tablet, furosemide (LASIX) 20 MG tablet  Generalized edema - Plan: furosemide (LASIX) 20 MG tablet No Follow-up on file.

## 2016-10-12 ENCOUNTER — Ambulatory Visit
Admission: RE | Admit: 2016-10-12 | Discharge: 2016-10-12 | Disposition: A | Discharge status: Home / Self Care | Payer: Medicare HMO | Source: Ambulatory Visit | Attending: Family Medicine | Admitting: Family Medicine

## 2016-10-12 DIAGNOSIS — Z1231 Encounter for screening mammogram for malignant neoplasm of breast: Secondary | ICD-10-CM

## 2016-11-08 DIAGNOSIS — H402231 Chronic angle-closure glaucoma, bilateral, mild stage: Secondary | ICD-10-CM | POA: Diagnosis not present

## 2017-03-06 ENCOUNTER — Encounter: Payer: Self-pay | Admitting: Primary Care

## 2017-03-06 ENCOUNTER — Ambulatory Visit (INDEPENDENT_AMBULATORY_CARE_PROVIDER_SITE_OTHER): Payer: Medicare HMO | Admitting: Primary Care

## 2017-03-06 VITALS — BP 130/72 | HR 78 | Temp 98.2°F | Resp 16 | Wt 164.0 lb

## 2017-03-06 DIAGNOSIS — J209 Acute bronchitis, unspecified: Secondary | ICD-10-CM | POA: Diagnosis not present

## 2017-03-06 MED ORDER — AZITHROMYCIN 250 MG PO TABS: ORAL_TABLET | ORAL | 0 refills | Status: DC

## 2017-03-06 MED ORDER — HYDROCOD POLST-CPM POLST ER 10-8 MG/5ML PO SUER: 5.0000 mL | Freq: Two times a day (BID) | ORAL | 0 refills | Status: DC | PRN

## 2017-03-06 NOTE — Progress Notes (Signed)
Subjective:    Patient ID: Kayla Alvarez, female    DOB: 1934-02-01, 83 y.o.   MRN: [id number]  HPI  Kayla Alvarez is an 83 year old female with a history of allergic rhinitis, asthma who presents today with a chief complaint of cough. She also reports chest congestion, fatigue, wheezing. Her symptoms began 10 days ago. Her cough is productive with yellow sputum. She's taken Delsym, Dayquil/Nyquil, Mucinex without any improvement. She did use an albuterol inhaler infrequently with some improvement. She denies fevers, sore throat, shortness of breath.   Review of Systems  Constitutional: Positive for fatigue. Negative for chills and fever.  HENT: Positive for congestion. Negative for ear pain, sinus pressure and sore throat.   Respiratory: Positive for cough and wheezing. Negative for shortness of breath.        Past Medical History:  Diagnosis Date  . ALLERGIC RHINITIS 06/05/2007  . BREAST LUMP 05/19/2010  . CONTACT DERMATITIS&OTH ECZEMA DUE OTH [place] AGENT 02/02/2010  . Edema 07/19/2009  . Glaucoma   . HERPES ZOSTER 11/28/2009  . HYPERLIPIDEMIA 06/05/2007  . PULMONARY NODULE, LEFT UPPER LOBE 07/26/2009  . RENAL INSUFFICIENCY 06/05/2007  . UTI 09/03/2008  . VAGINITIS, ATROPHIC 06/26/2007  . Venous insufficiency      Social History   Social History  . Marital status: Married    Spouse name: N/A  . Number of children: N/A  . Years of education: N/A   Occupational History  . Not on file.   Social History Main Topics  . Smoking status: Former Research scientist (life sciences)  . Smokeless tobacco: Never Used  . Alcohol use No  . Drug use: No  . Sexual activity: No     Comment: 20+ years   Other Topics Concern  . Not on file   Social History Narrative   Desires CPR and life support if not futile    Past Surgical History:  Procedure Laterality Date  . CESAREAN SECTION    . EYE SURGERY    . HEMORRHOID SURGERY    . TOOTH EXTRACTION      Family History  Problem Relation Age of Onset  . Hypertension  Mother   . Stroke Mother   . Heart disease Father   . Hypertension Father   . Breast cancer Sister   . Hypertension Sister     No Known Allergies  Current Outpatient Prescriptions on File Prior to Visit  Medication Sig Dispense Refill  . furosemide (LASIX) 20 MG tablet Take 1 tablet (20 mg total) by mouth daily. 90 tablet 2  . simvastatin (ZOCOR) 20 MG tablet Take 1 tablet (20 mg total) by mouth at bedtime. 90 tablet 2   No current facility-administered medications on file prior to visit.     BP 130/72 (BP Location: Left Arm, Patient Position: Sitting, Cuff Size: Large)   Pulse 78   Temp 98.2 F (36.8 C) (Oral)   Resp 16   Wt 164 lb (74.4 kg)   SpO2 97%   BMI 32.03 kg/m    Objective:   Physical Exam  Constitutional: She appears well-nourished. She appears ill.  HENT:  Right Ear: Ear canal normal. Tympanic membrane is not erythematous. A middle ear effusion is present.  Left Ear: Ear canal normal. Tympanic membrane is not erythematous. A middle ear effusion is present.  Nose: Right sinus exhibits no maxillary sinus tenderness and no frontal sinus tenderness. Left sinus exhibits no maxillary sinus tenderness and no frontal sinus tenderness.  Mouth/Throat: Oropharynx is clear  and moist.  Eyes: Conjunctivae are normal.  Neck: Neck supple.  Cardiovascular: Normal rate and regular rhythm.   Pulmonary/Chest: Effort normal. She has no decreased breath sounds. She has wheezes in the right upper field and the left upper field. She has rhonchi in the right upper field, the right lower field and the left lower field. She has no rales.  Lymphadenopathy:    She has no cervical adenopathy.  Skin: Skin is warm and dry.          Assessment & Plan:  Acute Bronchitis:  Cough, congestion x 10 days. Little to no improvement on OTC meds. Exam today suggestive of likely bacterial involvement. Rx for Zpak sent to pharmacy. Rx printed for Tussionex HS. Drowsiness precautions provided.  Continue Mucinex. Use albuterol every 4 hours PRN. Fluids, rest, follow up PRN.  Sheral Flow, NP

## 2017-03-06 NOTE — Patient Instructions (Signed)
Start Azithromycin antibiotics. Take 2 tablets by mouth today, then 1 tablet daily for 4 additional days.  You may take the Tussionex cough suppressant twice daily as needed for cough and rest. Caution this medication contains codeine and will make you feel drowsy.  Continue Mucinex for chest congestion. Ensure you are staying hydrated.  It was a pleasure meeting you!

## 2017-03-06 NOTE — Progress Notes (Signed)
Pre visit review using our clinic review tool, if applicable. No additional management support is needed unless otherwise documented below in the visit note. 

## 2017-05-17 DIAGNOSIS — H35373 Puckering of macula, bilateral: Secondary | ICD-10-CM | POA: Diagnosis not present

## 2017-05-17 DIAGNOSIS — H35033 Hypertensive retinopathy, bilateral: Secondary | ICD-10-CM | POA: Diagnosis not present

## 2017-05-17 DIAGNOSIS — H402211 Chronic angle-closure glaucoma, right eye, mild stage: Secondary | ICD-10-CM | POA: Diagnosis not present

## 2017-05-17 DIAGNOSIS — H402221 Chronic angle-closure glaucoma, left eye, mild stage: Secondary | ICD-10-CM | POA: Diagnosis not present

## 2017-05-17 LAB — HM DIABETES EYE EXAM

## 2017-06-28 DIAGNOSIS — H402231 Chronic angle-closure glaucoma, bilateral, mild stage: Secondary | ICD-10-CM | POA: Diagnosis not present

## 2017-06-28 DIAGNOSIS — H04123 Dry eye syndrome of bilateral lacrimal glands: Secondary | ICD-10-CM | POA: Diagnosis not present

## 2017-06-28 DIAGNOSIS — H1851 Endothelial corneal dystrophy: Secondary | ICD-10-CM | POA: Diagnosis not present

## 2017-06-28 DIAGNOSIS — H40053 Ocular hypertension, bilateral: Secondary | ICD-10-CM | POA: Diagnosis not present

## 2017-07-04 DIAGNOSIS — H402211 Chronic angle-closure glaucoma, right eye, mild stage: Secondary | ICD-10-CM | POA: Diagnosis not present

## 2017-07-14 ENCOUNTER — Other Ambulatory Visit: Payer: Self-pay | Admitting: Family Medicine

## 2017-07-14 DIAGNOSIS — J984 Other disorders of lung: Secondary | ICD-10-CM

## 2017-07-14 DIAGNOSIS — N259 Disorder resulting from impaired renal tubular function, unspecified: Secondary | ICD-10-CM

## 2017-07-14 DIAGNOSIS — E785 Hyperlipidemia, unspecified: Secondary | ICD-10-CM

## 2017-07-18 DIAGNOSIS — H402221 Chronic angle-closure glaucoma, left eye, mild stage: Secondary | ICD-10-CM | POA: Diagnosis not present

## 2017-09-07 ENCOUNTER — Other Ambulatory Visit: Payer: Self-pay | Admitting: Family Medicine

## 2017-09-07 DIAGNOSIS — Z1231 Encounter for screening mammogram for malignant neoplasm of breast: Secondary | ICD-10-CM

## 2017-10-08 ENCOUNTER — Encounter: Payer: Medicare HMO | Admitting: Family Medicine

## 2017-10-11 ENCOUNTER — Encounter: Payer: Self-pay | Admitting: Gastroenterology

## 2017-10-15 ENCOUNTER — Ambulatory Visit: Payer: Medicare HMO

## 2017-10-15 ENCOUNTER — Ambulatory Visit
Admission: RE | Admit: 2017-10-15 | Discharge: 2017-10-15 | Disposition: A | Discharge status: Home / Self Care | Payer: Medicare HMO | Source: Ambulatory Visit | Attending: Family Medicine | Admitting: Family Medicine

## 2017-10-15 DIAGNOSIS — Z1231 Encounter for screening mammogram for malignant neoplasm of breast: Secondary | ICD-10-CM

## 2017-10-18 ENCOUNTER — Telehealth: Payer: Self-pay | Admitting: Family Medicine

## 2017-10-18 DIAGNOSIS — E785 Hyperlipidemia, unspecified: Secondary | ICD-10-CM

## 2017-10-18 DIAGNOSIS — J984 Other disorders of lung: Secondary | ICD-10-CM

## 2017-10-18 DIAGNOSIS — N259 Disorder resulting from impaired renal tubular function, unspecified: Secondary | ICD-10-CM

## 2017-10-18 MED ORDER — SIMVASTATIN 20 MG PO TABS: 20.0000 mg | ORAL_TABLET | Freq: Every day | ORAL | 0 refills | Status: DC

## 2017-10-18 NOTE — Telephone Encounter (Signed)
Copied from [place] [id number]. Topic: Quick Communication - See Telephone Encounter >> Oct 18, 2017 10:32 AM Aurelio Brash B wrote: CRM for notification. See Telephone encounter for:  Refill simvastatin  [facility]  [place]  pt has 1 left 10/18/17.

## 2017-11-05 ENCOUNTER — Ambulatory Visit (INDEPENDENT_AMBULATORY_CARE_PROVIDER_SITE_OTHER): Payer: Medicare HMO

## 2017-11-05 ENCOUNTER — Ambulatory Visit (INDEPENDENT_AMBULATORY_CARE_PROVIDER_SITE_OTHER): Payer: Medicare HMO | Admitting: Family Medicine

## 2017-11-05 ENCOUNTER — Telehealth: Payer: Self-pay

## 2017-11-05 ENCOUNTER — Encounter: Payer: Self-pay | Admitting: Family Medicine

## 2017-11-05 VITALS — BP 118/78 | HR 67 | Temp 98.2°F | Ht 60.0 in | Wt 163.6 lb

## 2017-11-05 DIAGNOSIS — M5432 Sciatica, left side: Secondary | ICD-10-CM | POA: Insufficient documentation

## 2017-11-05 DIAGNOSIS — E785 Hyperlipidemia, unspecified: Secondary | ICD-10-CM

## 2017-11-05 DIAGNOSIS — I10 Essential (primary) hypertension: Secondary | ICD-10-CM | POA: Diagnosis not present

## 2017-11-05 DIAGNOSIS — E119 Type 2 diabetes mellitus without complications: Secondary | ICD-10-CM | POA: Diagnosis not present

## 2017-11-05 DIAGNOSIS — J984 Other disorders of lung: Secondary | ICD-10-CM

## 2017-11-05 DIAGNOSIS — Z0001 Encounter for general adult medical examination with abnormal findings: Secondary | ICD-10-CM

## 2017-11-05 DIAGNOSIS — Z01419 Encounter for gynecological examination (general) (routine) without abnormal findings: Secondary | ICD-10-CM

## 2017-11-05 DIAGNOSIS — Z0000 Encounter for general adult medical examination without abnormal findings: Secondary | ICD-10-CM

## 2017-11-05 DIAGNOSIS — N259 Disorder resulting from impaired renal tubular function, unspecified: Secondary | ICD-10-CM

## 2017-11-05 DIAGNOSIS — M5136 Other intervertebral disc degeneration, lumbar region: Secondary | ICD-10-CM | POA: Diagnosis not present

## 2017-11-05 LAB — LIPID PANEL
CHOL/HDL RATIO: 3
Cholesterol: 153 mg/dL (ref 0–200)
HDL: 53.4 mg/dL (ref >39.00)
LDL CALC: 84 mg/dL (ref 0–99)
NONHDL: 99.73
Triglycerides: 77 mg/dL (ref 0.0–149.0)
VLDL: 15.4 mg/dL (ref 0.0–40.0)

## 2017-11-05 LAB — COMPREHENSIVE METABOLIC PANEL
ALT: 19 U/L (ref 0–35)
AST: 19 U/L (ref 0–37)
Albumin: 4 g/dL (ref 3.5–5.2)
Alkaline Phosphatase: 54 U/L (ref 39–117)
BILIRUBIN TOTAL: 0.6 mg/dL (ref 0.2–1.2)
BUN: 12 mg/dL (ref 6–23)
CALCIUM: 9.1 mg/dL (ref 8.4–10.5)
CO2: 29 meq/L (ref 19–32)
CREATININE: 0.79 mg/dL (ref 0.40–1.20)
Chloride: 102 mEq/L (ref 96–112)
GFR: 74.23 mL/min (ref >60.00)
GLUCOSE: 133 mg/dL — AB (ref 70–99)
Potassium: 4.4 mEq/L (ref 3.5–5.1)
SODIUM: 140 meq/L (ref 135–145)
Total Protein: 6.4 g/dL (ref 6.0–8.3)

## 2017-11-05 LAB — TSH: TSH: 1.32 u[IU]/mL (ref 0.35–4.50)

## 2017-11-05 LAB — HEMOGLOBIN A1C: HEMOGLOBIN A1C: 6.5 % (ref 4.6–6.5)

## 2017-11-05 MED ORDER — SIMVASTATIN 20 MG PO TABS: 20.0000 mg | ORAL_TABLET | Freq: Every day | ORAL | 3 refills | Status: DC

## 2017-11-05 NOTE — Assessment & Plan Note (Signed)
The patients weight, height, BMI and visual acuity have been recorded in the chart.  Cognitive function assessed.   I have made referrals, counseling and provided education to the patient based review of the above and I have provided the pt with a written personalized care plan for preventive services.  Orders Placed This Encounter  Procedures  . DG Lumbar Spine Complete  . Hemoglobin A1c  . Comprehensive metabolic panel  . Lipid panel  . TSH   Cologuard ordered.

## 2017-11-05 NOTE — Assessment & Plan Note (Signed)
Given duration of symptoms, xray today. The patient indicates understanding of these issues and agrees with the plan.

## 2017-11-05 NOTE — Progress Notes (Signed)
Subjective:   Patient ID: Kayla Alvarez, female    DOB: [birth year], 84 y.o.   MRN: [id number]  Kayla Alvarez is a pleasant 84 y.o. year old female who presents to clinic today with Annual Exam (Patient is here today for her annual exam.  She is currently fasting.  Declines flu shot.  She has hearing aids and wears them sometimes.  Had an eye exam within the last 6 months.) and Hip Pain (Patient also states that she has left hip pain that has been going on 4-5 years.  It has gotten worse and radiates down to her knee.  Some days are worse than others and when it is bad she can hardly move. She states when she stands a long time her feet will get numb.)  CPX and follow up of chronic medical conditions on 11/05/2017  HPI:  I have personally reviewed the Medicare Annual Wellness questionnaire and have noted 1. The patient's medical and social history 2. Their use of alcohol, tobacco or illicit drugs 3. Their current medications and supplements 4. The patient's functional ability including ADL's, fall risks, home safety risks and hearing or visual             impairment. 5. Diet and physical activities 6. Evidence for depression or mood disorders   End of life wishes discussed and updated in Social History.  The roster of all physicians providing medical care to patient - is listed in the CareTeams section of the chart.  Health Maintenance  Topic Date Due  . FOOT EXAM  11/10/2016  . HEMOGLOBIN A1C  04/04/2017  . URINE MICROALBUMIN  10/04/2017  . INFLUENZA VACCINE  07/08/2018 (Originally 05/30/2017)  . PNA vac Low Risk Adult (2 of 2 - PCV13) 10/11/2019 (Originally 06/25/2008)  . OPHTHALMOLOGY EXAM  05/17/2018  . TETANUS/TDAP  07/20/2019  . DEXA SCAN  Completed      New onset diabetes- a1c 6.5 in 09/2015. Now diet controlled. Denies increased thirst or urination.  Lab Results  Component Value Date   HGBA1C 6.3 10/04/2016   HLD- taking zocor 20 mg daily.  Due for labs. Lab Results    Component Value Date   CHOL 234 (H) 10/04/2016   HDL 57.70 10/04/2016   LDLCALC 160 (H) 10/04/2016   LDLDIRECT 145.8 09/15/2013   TRIG 84.0 10/04/2016   CHOLHDL 4 10/04/2016   Lab Results  Component Value Date   ALT 21 10/04/2016   AST 20 10/04/2016   ALKPHOS 59 10/04/2016   BILITOT 0.6 10/04/2016   She has been having some left back pain that radiates down her leg.  If it gets bad enough,  She does take alleve.  Has been ongoing for years but she does feel it is getting worse.  Current Outpatient Medications on File Prior to Visit  Medication Sig Dispense Refill  . furosemide (LASIX) 20 MG tablet Take 1 tablet (20 mg total) by mouth daily. 90 tablet 2   No current facility-administered medications on file prior to visit.     No Known Allergies  Past Medical History:  Diagnosis Date  . ALLERGIC RHINITIS 06/05/2007  . BREAST LUMP 05/19/2010  . CONTACT DERMATITIS&OTH ECZEMA DUE OTH [place] AGENT 02/02/2010  . Edema 07/19/2009  . Glaucoma   . HERPES ZOSTER 11/28/2009  . HYPERLIPIDEMIA 06/05/2007  . PULMONARY NODULE, LEFT UPPER LOBE 07/26/2009  . RENAL INSUFFICIENCY 06/05/2007  . UTI 09/03/2008  . VAGINITIS, ATROPHIC 06/26/2007  . Venous insufficiency  Past Surgical History:  Procedure Laterality Date  . CESAREAN SECTION    . EYE SURGERY    . HEMORRHOID SURGERY    . TOOTH EXTRACTION      Family History  Problem Relation Age of Onset  . Hypertension Mother   . Stroke Mother   . Heart disease Father   . Hypertension Father   . Breast cancer Sister   . Hypertension Sister     Social History   Socioeconomic History  . Marital status: Married    Spouse name: Not on file  . Number of children: Not on file  . Years of education: Not on file  . Highest education level: Not on file  Social Needs  . Financial resource strain: Not on file  . Food insecurity - worry: Not on file  . Food insecurity - inability: Not on file  . Transportation needs - medical: Not on file  .  Transportation needs - non-medical: Not on file  Occupational History  . Not on file  Tobacco Use  . Smoking status: Former Research scientist (life sciences)  . Smokeless tobacco: Never Used  Substance and Sexual Activity  . Alcohol use: No  . Drug use: No  . Sexual activity: No    Comment: 20+ years  Other Topics Concern  . Not on file  Social History Narrative   Desires CPR and life support if not futile     Review of Systems  Constitutional: Negative.   HENT: Negative.   Respiratory: Negative.   Cardiovascular: Negative.   Gastrointestinal: Negative.   Endocrine: Negative.   Genitourinary: Negative.   Musculoskeletal: Positive for back pain.  Allergic/Immunologic: Negative.   Neurological: Negative.   Hematological: Negative.   Psychiatric/Behavioral: Negative.   All other systems reviewed and are negative.      Objective:    BP 118/78 (BP Location: Left Arm, Patient Position: Sitting, Cuff Size: Normal)   Pulse 67   Temp 98.2 F (36.8 C) (Oral)   Ht 5' (1.524 m)   Wt 163 lb 9.6 oz (74.2 kg)   SpO2 98%   BMI 31.95 kg/m    Physical Exam   General:  Well-developed,well-nourished,in no acute distress; alert,appropriate and cooperative throughout examination Head:  normocephalic and atraumatic.   Eyes:  vision grossly intact, PERRL Ears:  R ear normal and L ear normal externally, TMs clear bilaterally Nose:  no external deformity.   Mouth:  good dentition.   Neck:  No deformities, masses, or tenderness noted. Breasts:  No mass, nodules, thickening, tenderness, bulging, retraction, inflamation, nipple discharge or skin changes noted.   Lungs:  Normal respiratory effort, chest expands symmetrically. Lungs are clear to auscultation, no crackles or wheezes. Heart:  Normal rate and regular rhythm. S1 and S2 normal without gallop, murmur, click, rub or other extra sounds. Abdomen:  Bowel sounds positive,abdomen soft and non-tender without masses, organomegaly or hernias noted. Msk:  No  deformity or scoliosis noted of thoracic or lumbar spine.   SLR neg bilaterally Extremities:  No clubbing, cyanosis, edema, or deformity noted with normal full range of motion of all joints.   Neurologic:  alert & oriented X3 and gait normal.   Skin:  Intact without suspicious lesions or rashes Cervical Nodes:  No lymphadenopathy noted Axillary Nodes:  No palpable lymphadenopathy Psych:  Cognition and judgment appear intact. Alert and cooperative with normal attention span and concentration. No apparent delusions, illusions, hallucinations        Assessment & Plan:   Medicare annual  wellness visit, subsequent  Well woman exam  New onset type 2 diabetes mellitus ([place])  Hyperlipidemia, unspecified hyperlipidemia type - Plan: simvastatin (ZOCOR) 20 MG tablet  Essential hypertension  Disorder resulting from impaired renal function - Plan: simvastatin (ZOCOR) 20 MG tablet  Lung disease - Plan: simvastatin (ZOCOR) 20 MG tablet No Follow-up on file.

## 2017-11-05 NOTE — Patient Instructions (Signed)
Great to see you. Happy New Year and Happy Rudene Anda!  We will call you with your results from today and you can view them online.

## 2017-11-05 NOTE — Telephone Encounter (Signed)
Pt agreed to cologuard/faxed to [facility]/she is aware that they will be contacting her/thx dmf

## 2017-11-05 NOTE — Assessment & Plan Note (Signed)
Well controlled 

## 2017-11-05 NOTE — Assessment & Plan Note (Signed)
Continue current rxs.  Check labs today. 

## 2017-11-12 ENCOUNTER — Encounter: Payer: Self-pay | Admitting: Family Medicine

## 2017-11-12 DIAGNOSIS — Z1211 Encounter for screening for malignant neoplasm of colon: Secondary | ICD-10-CM | POA: Diagnosis not present

## 2017-11-12 DIAGNOSIS — Z1212 Encounter for screening for malignant neoplasm of rectum: Secondary | ICD-10-CM | POA: Diagnosis not present

## 2017-11-12 LAB — COLOGUARD

## 2017-11-19 DIAGNOSIS — H04123 Dry eye syndrome of bilateral lacrimal glands: Secondary | ICD-10-CM | POA: Diagnosis not present

## 2017-11-19 DIAGNOSIS — H402231 Chronic angle-closure glaucoma, bilateral, mild stage: Secondary | ICD-10-CM | POA: Diagnosis not present

## 2017-11-19 DIAGNOSIS — H1851 Endothelial corneal dystrophy: Secondary | ICD-10-CM | POA: Diagnosis not present

## 2017-11-19 DIAGNOSIS — H40053 Ocular hypertension, bilateral: Secondary | ICD-10-CM | POA: Diagnosis not present

## 2017-11-21 DIAGNOSIS — D225 Melanocytic nevi of trunk: Secondary | ICD-10-CM | POA: Diagnosis not present

## 2017-11-21 DIAGNOSIS — L821 Other seborrheic keratosis: Secondary | ICD-10-CM | POA: Diagnosis not present

## 2017-11-21 DIAGNOSIS — L853 Xerosis cutis: Secondary | ICD-10-CM | POA: Diagnosis not present

## 2017-11-21 DIAGNOSIS — D1801 Hemangioma of skin and subcutaneous tissue: Secondary | ICD-10-CM | POA: Diagnosis not present

## 2017-11-21 DIAGNOSIS — Z85828 Personal history of other malignant neoplasm of skin: Secondary | ICD-10-CM | POA: Diagnosis not present

## 2017-11-21 DIAGNOSIS — L814 Other melanin hyperpigmentation: Secondary | ICD-10-CM | POA: Diagnosis not present

## 2017-11-21 DIAGNOSIS — D1721 Benign lipomatous neoplasm of skin and subcutaneous tissue of right arm: Secondary | ICD-10-CM | POA: Diagnosis not present

## 2017-11-21 DIAGNOSIS — L570 Actinic keratosis: Secondary | ICD-10-CM | POA: Diagnosis not present

## 2018-04-10 ENCOUNTER — Other Ambulatory Visit: Payer: Self-pay | Admitting: Family Medicine

## 2018-04-10 DIAGNOSIS — J984 Other disorders of lung: Secondary | ICD-10-CM

## 2018-04-10 DIAGNOSIS — N259 Disorder resulting from impaired renal tubular function, unspecified: Secondary | ICD-10-CM

## 2018-04-10 DIAGNOSIS — R601 Generalized edema: Secondary | ICD-10-CM

## 2018-05-15 DIAGNOSIS — H402231 Chronic angle-closure glaucoma, bilateral, mild stage: Secondary | ICD-10-CM | POA: Diagnosis not present

## 2018-05-15 DIAGNOSIS — H26493 Other secondary cataract, bilateral: Secondary | ICD-10-CM | POA: Diagnosis not present

## 2018-05-15 DIAGNOSIS — H35033 Hypertensive retinopathy, bilateral: Secondary | ICD-10-CM | POA: Diagnosis not present

## 2018-05-15 DIAGNOSIS — H353132 Nonexudative age-related macular degeneration, bilateral, intermediate dry stage: Secondary | ICD-10-CM | POA: Diagnosis not present

## 2018-05-30 DIAGNOSIS — H26491 Other secondary cataract, right eye: Secondary | ICD-10-CM | POA: Diagnosis not present

## 2018-06-13 DIAGNOSIS — H26491 Other secondary cataract, right eye: Secondary | ICD-10-CM | POA: Diagnosis not present

## 2018-06-27 DIAGNOSIS — H402231 Chronic angle-closure glaucoma, bilateral, mild stage: Secondary | ICD-10-CM | POA: Diagnosis not present

## 2018-09-13 ENCOUNTER — Other Ambulatory Visit: Payer: Self-pay | Admitting: Family Medicine

## 2018-09-13 DIAGNOSIS — Z1231 Encounter for screening mammogram for malignant neoplasm of breast: Secondary | ICD-10-CM

## 2018-10-24 ENCOUNTER — Ambulatory Visit: Payer: Medicare HMO

## 2018-11-05 NOTE — Progress Notes (Signed)
Subjective:   Patient ID: Kayla Alvarez, female    DOB: 1934-10-14, 85 y.o.   MRN: [id number]  Kayla Alvarez is a pleasant 85 y.o. year old female who presents to clinic today with Follow-up (Patient is here today for a F/U.  Cologuard is UTD. Mamm is scheduled.  In 2001 her BMD showed mild Osteopenia and re-eval was recommended pt declined. On 1.7.19 her A1C was controlled at 6.5. She is fasting.)  11/06/2018  HPI:    Health Maintenance  Topic Date Due  . URINE MICROALBUMIN  10/04/2017  . HEMOGLOBIN A1C  05/05/2018  . PNA vac Low Risk Adult (2 of 2 - PCV13) 10/11/2019 (Originally 06/25/2008)  . OPHTHALMOLOGY EXAM  05/27/2019  . TETANUS/TDAP  07/20/2019  . FOOT EXAM  11/07/2019  . DEXA SCAN  Completed  . INFLUENZA VACCINE  Discontinued   Osteopenia- due for repeat DEXA. She has mammogram scheduled for [facility].  Diabetes- a1c 6.5 in 09/2015. Now diet controlled. Denies increased thirst or urination.  Lab Results  Component Value Date   HGBA1C 6.2 (A) 11/06/2018   HGBA1C 6.2 11/06/2018   HLD- taking zocor 20 mg daily.  Due for labs. Lab Results  Component Value Date   CHOL 153 11/05/2017   HDL 53.40 11/05/2017   LDLCALC 84 11/05/2017   LDLDIRECT 145.8 09/15/2013   TRIG 77.0 11/05/2017   CHOLHDL 3 11/05/2017   Lab Results  Component Value Date   ALT 19 11/05/2017   AST 19 11/05/2017   ALKPHOS 54 11/05/2017   BILITOT 0.6 11/05/2017     No current outpatient medications on file prior to visit.   No current facility-administered medications on file prior to visit.     No Known Allergies  Past Medical History:  Diagnosis Date  . ALLERGIC RHINITIS 06/05/2007  . BREAST LUMP 05/19/2010  . CONTACT DERMATITIS&OTH ECZEMA DUE OTH [place] AGENT 02/02/2010  . Edema 07/19/2009  . Glaucoma   . HERPES ZOSTER 11/28/2009  . HYPERLIPIDEMIA 06/05/2007  . PULMONARY NODULE, LEFT UPPER LOBE 07/26/2009  . RENAL INSUFFICIENCY 06/05/2007  . UTI 09/03/2008  . VAGINITIS,  ATROPHIC 06/26/2007  . Venous insufficiency     Past Surgical History:  Procedure Laterality Date  . CESAREAN SECTION    . EYE SURGERY    . HEMORRHOID SURGERY    . TOOTH EXTRACTION      Family History  Problem Relation Age of Onset  . Hypertension Mother   . Stroke Mother   . Heart disease Father   . Hypertension Father   . Breast cancer Sister   . Hypertension Sister     Social History   Socioeconomic History  . Marital status: Married    Spouse name: Not on file  . Number of children: Not on file  . Years of education: Not on file  . Highest education level: Not on file  Occupational History  . Not on file  Social Needs  . Financial resource strain: Not on file  . Food insecurity:    Worry: Not on file    Inability: Not on file  . Transportation needs:    Medical: Not on file    Non-medical: Not on file  Tobacco Use  . Smoking status: Former Research scientist (life sciences)  . Smokeless tobacco: Never Used  Substance and Sexual Activity  . Alcohol use: No  . Drug use: No  . Sexual activity: Never    Comment: 20+ years  Lifestyle  . Physical  activity:    Days per week: Not on file    Minutes per session: Not on file  . Stress: Not on file  Relationships  . Social connections:    Talks on phone: Not on file    Gets together: Not on file    Attends religious service: Not on file    Active member of club or organization: Not on file    Attends meetings of clubs or organizations: Not on file    Relationship status: Not on file  . Intimate partner violence:    Fear of current or ex partner: Not on file    Emotionally abused: Not on file    Physically abused: Not on file    Forced sexual activity: Not on file  Other Topics Concern  . Not on file  Social History Narrative   Desires CPR and life support if not futile     Review of Systems  Constitutional: Negative.   HENT: Negative.   Respiratory: Negative.   Cardiovascular: Negative.   Gastrointestinal: Negative.     Endocrine: Negative.   Genitourinary: Negative.   Musculoskeletal: Negative.  Negative for back pain.  Allergic/Immunologic: Negative.   Neurological: Negative.   Hematological: Negative.   Psychiatric/Behavioral: Negative.   All other systems reviewed and are negative.      Objective:    BP 138/74 (BP Location: Left Arm, Patient Position: Sitting, Cuff Size: Normal)   Pulse 70   Temp 98 F (36.7 C) (Oral)   Ht 5' (1.524 m)   Wt 166 lb 3.2 oz (75.4 kg)   SpO2 96%   BMI 32.46 kg/m    Physical Exam     General:  Well-developed,well-nourished,in no acute distress; alert,appropriate and cooperative throughout examination Head:  normocephalic and atraumatic.   Eyes:  vision grossly intact, PERRL Ears:  R ear normal and L ear normal externally, TMs clear bilaterally Nose:  no external deformity.   Mouth:  good dentition.   Neck:  No deformities, masses, or tenderness noted. Breasts:  No mass, nodules, thickening, tenderness, bulging, retraction, inflamation, nipple discharge or skin changes noted.   Lungs:  Normal respiratory effort, scattered exp wheezes Heart:  Normal rate and regular rhythm. S1 and S2 normal without gallop, murmur, click, rub or other extra sounds. Abdomen:  Bowel sounds positive,abdomen soft and non-tender without masses, organomegaly or hernias noted. Msk:  No deformity or scoliosis noted of thoracic or lumbar spine.   Extremities:  No clubbing, cyanosis, edema, or deformity noted with normal full range of motion of all joints.   Neurologic:  alert & oriented X3 and gait normal.   Skin:  Intact without suspicious lesions or rashes Cervical Nodes:  No lymphadenopathy noted Axillary Nodes:  No palpable lymphadenopathy Psych:  Cognition and judgment appear intact. Alert and cooperative with normal attention span and concentration. No apparent delusions, illusions, hallucinations         Assessment & Plan:   Hyperlipidemia, unspecified  hyperlipidemia type - Plan: CBC with Differential/Platelet, Comprehensive metabolic panel, Lipid panel, TSH, simvastatin (ZOCOR) 20 MG tablet  Diabetes mellitus type 2, diet-controlled ([place]) - Plan: POCT HgB A1C, Urine Microalbumin w/creat. ratio  Retinal hemorrhage of both eyes  Essential hypertension - Plan: TSH  Generalized edema - Plan: furosemide (LASIX) 20 MG tablet  Disorder resulting from impaired renal function - Plan: furosemide (LASIX) 20 MG tablet, simvastatin (ZOCOR) 20 MG tablet  Lung disease - Plan: furosemide (LASIX) 20 MG tablet, simvastatin (ZOCOR) 20 MG tablet  Osteopenia, unspecified location - Plan: DG Bone Density  Estrogen deficiency - Plan: DG Bone Density No follow-ups on file.

## 2018-11-06 ENCOUNTER — Ambulatory Visit (INDEPENDENT_AMBULATORY_CARE_PROVIDER_SITE_OTHER): Payer: Medicare HMO

## 2018-11-06 ENCOUNTER — Encounter: Payer: Self-pay | Admitting: Family Medicine

## 2018-11-06 ENCOUNTER — Ambulatory Visit (INDEPENDENT_AMBULATORY_CARE_PROVIDER_SITE_OTHER): Payer: Medicare HMO | Admitting: Family Medicine

## 2018-11-06 VITALS — BP 138/74 | HR 70 | Temp 98.0°F | Ht 60.0 in | Wt 166.2 lb

## 2018-11-06 DIAGNOSIS — M8580 Other specified disorders of bone density and structure, unspecified site: Secondary | ICD-10-CM | POA: Diagnosis not present

## 2018-11-06 DIAGNOSIS — J984 Other disorders of lung: Secondary | ICD-10-CM

## 2018-11-06 DIAGNOSIS — N259 Disorder resulting from impaired renal tubular function, unspecified: Secondary | ICD-10-CM

## 2018-11-06 DIAGNOSIS — E785 Hyperlipidemia, unspecified: Secondary | ICD-10-CM

## 2018-11-06 DIAGNOSIS — J45909 Unspecified asthma, uncomplicated: Secondary | ICD-10-CM | POA: Diagnosis not present

## 2018-11-06 DIAGNOSIS — E2839 Other primary ovarian failure: Secondary | ICD-10-CM | POA: Diagnosis not present

## 2018-11-06 DIAGNOSIS — E119 Type 2 diabetes mellitus without complications: Secondary | ICD-10-CM | POA: Diagnosis not present

## 2018-11-06 DIAGNOSIS — I10 Essential (primary) hypertension: Secondary | ICD-10-CM

## 2018-11-06 DIAGNOSIS — H3563 Retinal hemorrhage, bilateral: Secondary | ICD-10-CM

## 2018-11-06 DIAGNOSIS — N898 Other specified noninflammatory disorders of vagina: Secondary | ICD-10-CM | POA: Diagnosis not present

## 2018-11-06 DIAGNOSIS — R601 Generalized edema: Secondary | ICD-10-CM | POA: Diagnosis not present

## 2018-11-06 DIAGNOSIS — R062 Wheezing: Secondary | ICD-10-CM | POA: Diagnosis not present

## 2018-11-06 LAB — POCT URINALYSIS DIPSTICK
BILIRUBIN UA: NEGATIVE
GLUCOSE UA: NEGATIVE
Ketones, UA: NEGATIVE
Nitrite, UA: NEGATIVE
Protein, UA: POSITIVE — AB
Spec Grav, UA: >=1.03 — AB (ref 1.010–1.025)
Urobilinogen, UA: 0.2 E.U./dL
pH, UA: 6 (ref 5.0–8.0)

## 2018-11-06 LAB — COMPREHENSIVE METABOLIC PANEL
ALK PHOS: 50 U/L (ref 39–117)
ALT: 18 U/L (ref 0–35)
AST: 17 U/L (ref 0–37)
Albumin: 3.9 g/dL (ref 3.5–5.2)
BUN: 9 mg/dL (ref 6–23)
CO2: 30 mEq/L (ref 19–32)
Calcium: 9.6 mg/dL (ref 8.4–10.5)
Chloride: 104 mEq/L (ref 96–112)
Creatinine, Ser: 0.78 mg/dL (ref 0.40–1.20)
GFR: 75.14 mL/min (ref >60.00)
GLUCOSE: 122 mg/dL — AB (ref 70–99)
POTASSIUM: 4.5 meq/L (ref 3.5–5.1)
SODIUM: 140 meq/L (ref 135–145)
TOTAL PROTEIN: 6.4 g/dL (ref 6.0–8.3)
Total Bilirubin: 0.6 mg/dL (ref 0.2–1.2)

## 2018-11-06 LAB — LIPID PANEL
Cholesterol: 173 mg/dL (ref 0–200)
HDL: 52.4 mg/dL (ref >39.00)
LDL CALC: 103 mg/dL — AB (ref 0–99)
NONHDL: 120.54
Total CHOL/HDL Ratio: 3
Triglycerides: 89 mg/dL (ref 0.0–149.0)
VLDL: 17.8 mg/dL (ref 0.0–40.0)

## 2018-11-06 LAB — CBC WITH DIFFERENTIAL/PLATELET
BASOS PCT: 1.1 % (ref 0.0–3.0)
Basophils Absolute: 0.1 10*3/uL (ref 0.0–0.1)
EOS PCT: 2.5 % (ref 0.0–5.0)
Eosinophils Absolute: 0.2 10*3/uL (ref 0.0–0.7)
HEMATOCRIT: 42.2 % (ref 36.0–46.0)
HEMOGLOBIN: 14.1 g/dL (ref 12.0–15.0)
LYMPHS PCT: 29 % (ref 12.0–46.0)
Lymphs Abs: 2.1 10*3/uL (ref 0.7–4.0)
MCHC: 33.4 g/dL (ref 30.0–36.0)
MCV: 90.4 fl (ref 78.0–100.0)
MONO ABS: 0.7 10*3/uL (ref 0.1–1.0)
MONOS PCT: 10.3 % (ref 3.0–12.0)
Neutro Abs: 4.1 10*3/uL (ref 1.4–7.7)
Neutrophils Relative %: 57.1 % (ref 43.0–77.0)
Platelets: 222 10*3/uL (ref 150.0–400.0)
RBC: 4.67 Mil/uL (ref 3.87–5.11)
RDW: 13.1 % (ref 11.5–15.5)
WBC: 7.2 10*3/uL (ref 4.0–10.5)

## 2018-11-06 LAB — MICROALBUMIN / CREATININE URINE RATIO
Creatinine,U: 176.7 mg/dL
MICROALB/CREAT RATIO: 1.3 mg/g (ref 0.0–30.0)
Microalb, Ur: 2.4 mg/dL — ABNORMAL HIGH (ref 0.0–1.9)

## 2018-11-06 LAB — TSH: TSH: 1.08 u[IU]/mL (ref 0.35–4.50)

## 2018-11-06 LAB — POCT GLYCOSYLATED HEMOGLOBIN (HGB A1C)
HEMOGLOBIN A1C: 6.2 % — AB (ref 4.0–5.6)
HbA1c POC (<> result, manual entry): 6.2 % (ref 4.0–5.6)

## 2018-11-06 MED ORDER — ALBUTEROL SULFATE HFA 108 (90 BASE) MCG/ACT IN AERS: 2.0000 | INHALATION_SPRAY | Freq: Four times a day (QID) | RESPIRATORY_TRACT | 0 refills | Status: DC | PRN

## 2018-11-06 MED ORDER — FUROSEMIDE 20 MG PO TABS: 20.0000 mg | ORAL_TABLET | Freq: Every day | ORAL | 3 refills | Status: DC

## 2018-11-06 MED ORDER — SIMVASTATIN 20 MG PO TABS: 20.0000 mg | ORAL_TABLET | Freq: Every day | ORAL | 3 refills | Status: DC

## 2018-11-06 NOTE — Addendum Note (Signed)
Addended by: Marrion Coy on: 11/06/2018 01:42 PM   Modules accepted: Orders

## 2018-11-06 NOTE — Assessment & Plan Note (Signed)
Continue current dose of zocor. Check labs today.

## 2018-11-06 NOTE — Assessment & Plan Note (Signed)
Well controlled without rx. 

## 2018-11-06 NOTE — Assessment & Plan Note (Signed)
Remains diet controlled. No changes made today. Lab Results  Component Value Date   HGBA1C 6.5 11/05/2017

## 2018-11-06 NOTE — Assessment & Plan Note (Addendum)
Having more wheezing and congestion since she "got over" a cold last month. Pt is requesting a CXR which is reasonable in a former smoker.Refill proair inhaler, otherwise exam is reassuring. CXR ordered.

## 2018-11-06 NOTE — Assessment & Plan Note (Signed)
Followed by retinal specialist.

## 2018-11-06 NOTE — Assessment & Plan Note (Addendum)
DEXA recommended to be scheduled wit hher mammogram but she is declining.

## 2018-11-06 NOTE — Patient Instructions (Addendum)
Great to see you. I will call you with  lab results from today and you can view them online.   Happy New Year.

## 2018-11-06 NOTE — Addendum Note (Signed)
Addended by: Lynnea Ferrier on: 11/06/2018 01:20 PM   Modules accepted: Orders

## 2018-11-06 NOTE — Addendum Note (Signed)
Addended by: Marrion Coy on: 11/06/2018 12:59 PM   Modules accepted: Orders

## 2018-11-07 LAB — URINE CULTURE
MICRO NUMBER:: 27940
SPECIMEN QUALITY:: ADEQUATE

## 2018-11-14 ENCOUNTER — Ambulatory Visit
Admission: RE | Admit: 2018-11-14 | Discharge: 2018-11-14 | Disposition: A | Discharge status: Home / Self Care | Payer: Medicare HMO | Source: Ambulatory Visit | Attending: Family Medicine | Admitting: Family Medicine

## 2018-11-14 DIAGNOSIS — Z1231 Encounter for screening mammogram for malignant neoplasm of breast: Secondary | ICD-10-CM | POA: Diagnosis not present

## 2018-12-11 DIAGNOSIS — H402231 Chronic angle-closure glaucoma, bilateral, mild stage: Secondary | ICD-10-CM | POA: Diagnosis not present

## 2018-12-11 DIAGNOSIS — L718 Other rosacea: Secondary | ICD-10-CM | POA: Diagnosis not present

## 2018-12-11 DIAGNOSIS — H04221 Epiphora due to insufficient drainage, right lacrimal gland: Secondary | ICD-10-CM | POA: Diagnosis not present

## 2018-12-11 DIAGNOSIS — H1851 Endothelial corneal dystrophy: Secondary | ICD-10-CM | POA: Diagnosis not present

## 2018-12-23 DIAGNOSIS — L821 Other seborrheic keratosis: Secondary | ICD-10-CM | POA: Diagnosis not present

## 2018-12-23 DIAGNOSIS — D225 Melanocytic nevi of trunk: Secondary | ICD-10-CM | POA: Diagnosis not present

## 2018-12-23 DIAGNOSIS — Z85828 Personal history of other malignant neoplasm of skin: Secondary | ICD-10-CM | POA: Diagnosis not present

## 2018-12-23 DIAGNOSIS — L304 Erythema intertrigo: Secondary | ICD-10-CM | POA: Diagnosis not present

## 2018-12-23 DIAGNOSIS — L814 Other melanin hyperpigmentation: Secondary | ICD-10-CM | POA: Diagnosis not present

## 2019-06-12 DIAGNOSIS — H35033 Hypertensive retinopathy, bilateral: Secondary | ICD-10-CM | POA: Diagnosis not present

## 2019-06-12 DIAGNOSIS — H353132 Nonexudative age-related macular degeneration, bilateral, intermediate dry stage: Secondary | ICD-10-CM | POA: Diagnosis not present

## 2019-06-12 DIAGNOSIS — H402231 Chronic angle-closure glaucoma, bilateral, mild stage: Secondary | ICD-10-CM | POA: Diagnosis not present

## 2019-06-12 DIAGNOSIS — H35373 Puckering of macula, bilateral: Secondary | ICD-10-CM | POA: Diagnosis not present

## 2019-09-17 DIAGNOSIS — H402231 Chronic angle-closure glaucoma, bilateral, mild stage: Secondary | ICD-10-CM | POA: Diagnosis not present

## 2019-11-06 ENCOUNTER — Other Ambulatory Visit: Payer: Self-pay | Admitting: Family Medicine

## 2019-11-06 DIAGNOSIS — D225 Melanocytic nevi of trunk: Secondary | ICD-10-CM | POA: Diagnosis not present

## 2019-11-06 DIAGNOSIS — L570 Actinic keratosis: Secondary | ICD-10-CM | POA: Diagnosis not present

## 2019-11-06 DIAGNOSIS — L821 Other seborrheic keratosis: Secondary | ICD-10-CM | POA: Diagnosis not present

## 2019-11-06 DIAGNOSIS — L814 Other melanin hyperpigmentation: Secondary | ICD-10-CM | POA: Diagnosis not present

## 2019-11-06 DIAGNOSIS — D1721 Benign lipomatous neoplasm of skin and subcutaneous tissue of right arm: Secondary | ICD-10-CM | POA: Diagnosis not present

## 2019-11-06 DIAGNOSIS — Z1231 Encounter for screening mammogram for malignant neoplasm of breast: Secondary | ICD-10-CM

## 2019-11-06 DIAGNOSIS — D485 Neoplasm of uncertain behavior of skin: Secondary | ICD-10-CM | POA: Diagnosis not present

## 2019-11-06 DIAGNOSIS — Z85828 Personal history of other malignant neoplasm of skin: Secondary | ICD-10-CM | POA: Diagnosis not present

## 2019-11-06 DIAGNOSIS — D1801 Hemangioma of skin and subcutaneous tissue: Secondary | ICD-10-CM | POA: Diagnosis not present

## 2019-11-08 DIAGNOSIS — Z0000 Encounter for general adult medical examination without abnormal findings: Secondary | ICD-10-CM | POA: Insufficient documentation

## 2019-11-08 NOTE — Assessment & Plan Note (Signed)
Reviewed preventive care protocols, scheduled due services, and updated immunizations Discussed nutrition, exercise, diet, and healthy lifestyle.  Mammogram scheduled for 12/17/19. Negative Cologuard 11/12/17.

## 2019-11-08 NOTE — Assessment & Plan Note (Addendum)
Lipids:    Component Value Date/Time   CHOL 173 11/06/2018 1314   TRIG 89.0 11/06/2018 1314   HDL 52.40 11/06/2018 1314   LDLDIRECT 145.8 09/15/2013 1205   VLDL 17.8 11/06/2018 1314   CHOLHDL 3 11/06/2018 1314     The patient does not use medications that may worsen dyslipidemias (corticosteroids, progestins, anabolic steroids, diuretics, beta-blockers, amiodarone, cyclosporine, olanzapine). The patient exercises infrequently. The patient is not known to have coexisting coronary artery disease.   Assessment:  Dyslipidemia. Target levels for LDL are: < 100 mg/dl (CHD or "CHD risk equivalent" is present).  Explained to the patient the respective contributions of genetics, diet, and exercise to lipid levels and the use of medication in severe cases which do not respond to lifestyle alteration. The patient's interest and motivation in making lifestyle changes seems good.   Plan: The following changes are planned for the next 3 months, at which time the patient will return for repeat fasting lipids:  1. Dietary changes: Increase soluble fiber. Decrease simple carbohydrates. 2. Exercise changes: An average 40 minutes of moderate to vigorous-intensity aerobic activity 3 or 4 times per week. 3. Lipid-lowering medications: simvastatin 20 mg qhs. (Recommended by NCEP after 3-6 mos of dietary therapy & lifestyle modification, except if CHD is present or LDL well above 190.) 5. Hormone replacement therapy (patient is a postmenopausal  woman): no 6. Screening for secondary causes of dyslipidemias: SEE ORDERS.

## 2019-11-08 NOTE — Assessment & Plan Note (Signed)
History: Review: taking medications as instructed, no medication side effects noted, no TIAs, no chest pain on exertion, no dyspnea on exertion, no swelling of ankles. Smoker: No.  BP Readings from Last 3 Encounters:  11/06/18 138/74  11/05/17 118/78  03/06/17 130/72   Lab Results  Component Value Date   CREATININE 0.78 11/06/2018     Assessment/Plan: 1. Medication: no change. 2. Dietary sodium restriction. 3. Regular aerobic exercise. 4. Check blood pressures monthly and record.

## 2019-11-08 NOTE — Assessment & Plan Note (Signed)
Diet controlled, further diabetic ROS: no polyuria or polydipsia, no chest pain, dyspnea or TIA's, no numbness, tingling or pain in extremities. Lab Results  Component Value Date   HGBA1C 6.2 (A) 11/06/2018   HGBA1C 6.2 11/06/2018   HGBA1C 6.5 11/05/2017   Lab Results  Component Value Date   MICROALBUR 2.4 (H) 11/06/2018   LDLCALC 103 (H) 11/06/2018   CREATININE 0.78 11/06/2018    Health Maintenance 1.  Patient is counseled on appropriate foot care. 2.  BP goal < 130/80.  Not on ARB or ACE- due for urine micro. 3.  LDL goal of < 100, HDL > 40 and TG < 150. All diabetic should be on a statin unless contraindication- on statin. 4.  Eye Exam yearly and Dental Exam every 6 months. 5.  Dietary recommendations: < 100 g carbohydrates daily. 6.  Physical Activity recommendations: As tolerated aerobic and strength exercises.  7.  Appropriate vaccines reviewed.

## 2019-11-08 NOTE — Progress Notes (Signed)
Subjective:    Patient ID: Kayla Alvarez, female    DOB: 1934/10/24, 86 y.o.   MRN: [id number]  Chief Complaint  Patient presents with  . Annual Exam    Pt c/o urine leakage and having cramps at bottom of feet near har toes, x 1 yr.  Pt left urine sample for lab and is also fasting for labs today.    HPI Patient is in today for CPX and follow up of chronic medical conditions.  Health Maintenance  Topic Date Due  . HEMOGLOBIN A1C  05/07/2019  . URINE MICROALBUMIN  11/07/2019  . TETANUS/TDAP  11/09/2020 (Originally 07/20/2019)  . PNA vac Low Risk Adult (2 of 2 - PCV13) 11/09/2020 (Originally 06/25/2008)  . OPHTHALMOLOGY EXAM  09/17/2020  . FOOT EXAM  11/09/2020  . DEXA SCAN  Completed  . INFLUENZA VACCINE  Discontinued    Mammogram scheduled for 12/17/19. Negative Cologuard 11/12/17.     Past Medical History:  Diagnosis Date  . ALLERGIC RHINITIS 06/05/2007  . BREAST LUMP 05/19/2010  . CONTACT DERMATITIS&OTH ECZEMA DUE OTH [place] AGENT 02/02/2010  . Edema 07/19/2009  . Glaucoma   . HERPES ZOSTER 11/28/2009  . HYPERLIPIDEMIA 06/05/2007  . PULMONARY NODULE, LEFT UPPER LOBE 07/26/2009  . RENAL INSUFFICIENCY 06/05/2007  . UTI 09/03/2008  . VAGINITIS, ATROPHIC 06/26/2007  . Venous insufficiency     Past Surgical History:  Procedure Laterality Date  . CESAREAN SECTION    . EYE SURGERY    . HEMORRHOID SURGERY    . TOOTH EXTRACTION      Family History  Problem Relation Age of Onset  . Hypertension Mother   . Stroke Mother   . Heart disease Father   . Hypertension Father   . Breast cancer Sister        unsure of age  . Hypertension Sister     Social History   Socioeconomic History  . Marital status: Married    Spouse name: Not on file  . Number of children: Not on file  . Years of education: Not on file  . Highest education level: Not on file  Occupational History  . Not on file  Tobacco Use  . Smoking status: Former Research scientist (life sciences)  . Smokeless tobacco: Never Used  Substance  and Sexual Activity  . Alcohol use: No  . Drug use: No  . Sexual activity: Never    Comment: 20+ years  Other Topics Concern  . Not on file  Social History Narrative   Desires CPR and life support if not futile   Social Determinants of Health   Financial Resource Strain:   . Difficulty of Paying Living Expenses: Not on file  Food Insecurity:   . Worried About Charity fundraiser in the Last Year: Not on file  . Ran Out of Food in the Last Year: Not on file  Transportation Needs:   . Lack of Transportation (Medical): Not on file  . Lack of Transportation (Non-Medical): Not on file  Physical Activity:   . Days of Exercise per Week: Not on file  . Minutes of Exercise per Session: Not on file  Stress:   . Feeling of Stress : Not on file  Social Connections:   . Frequency of Communication with Friends and Family: Not on file  . Frequency of Social Gatherings with Friends and Family: Not on file  . Attends Religious Services: Not on file  . Active Member of Clubs or Organizations: Not on file  .  Attends Archivist Meetings: Not on file  . Marital Status: Not on file  Intimate Partner Violence:   . Fear of Current or Ex-Partner: Not on file  . Emotionally Abused: Not on file  . Physically Abused: Not on file  . Sexually Abused: Not on file    Outpatient Medications Prior to Visit  Medication Sig Dispense Refill  . albuterol (PROVENTIL HFA;VENTOLIN HFA) 108 (90 Base) MCG/ACT inhaler Inhale 2 puffs into the lungs every 6 (six) hours as needed. 1 Inhaler 0  . furosemide (LASIX) 20 MG tablet Take 1 tablet (20 mg total) by mouth daily. 90 tablet 3  . simvastatin (ZOCOR) 20 MG tablet Take 1 tablet (20 mg total) by mouth at bedtime. 90 tablet 3   No facility-administered medications prior to visit.    No Known Allergies  Review of Systems  Constitutional: Negative for fever and malaise/fatigue.  HENT: Negative.  Negative for congestion and hearing loss.   Eyes:  Negative.  Negative for blurred vision, discharge and redness.  Respiratory: Negative.  Negative for cough and shortness of breath.   Cardiovascular: Negative.  Negative for chest pain, palpitations and leg swelling.  Gastrointestinal: Negative.  Negative for abdominal pain and heartburn.  Genitourinary: Positive for urgency. Negative for dysuria, flank pain, frequency and hematuria.  Musculoskeletal: Negative.  Negative for falls.  Skin: Negative.  Negative for rash.  Neurological: Negative.  Negative for loss of consciousness and headaches.  Endo/Heme/Allergies: Negative.  Does not bruise/bleed easily.  Psychiatric/Behavioral: Negative.  Negative for depression.  All other systems reviewed and are negative.      Objective:    Physical Exam Vitals and nursing note reviewed. Exam conducted with a chaperone present.  Constitutional:      Appearance: Normal appearance. She is not ill-appearing.  HENT:     Head: Normocephalic and atraumatic.     Right Ear: External ear normal.     Left Ear: External ear normal.     Nose: Nose normal.  Eyes:     General:        Right eye: No discharge.        Left eye: No discharge.  Cardiovascular:     Rate and Rhythm: Normal rate and regular rhythm.     Heart sounds: Normal heart sounds.  Pulmonary:     Effort: Pulmonary effort is normal.     Breath sounds: No wheezing.  Chest:     Breasts:        Right: Normal. No swelling, bleeding, inverted nipple, mass, nipple discharge, skin change or tenderness.        Left: Normal. No swelling, bleeding, inverted nipple, mass, nipple discharge, skin change or tenderness.  Abdominal:     Palpations: Abdomen is soft. There is no mass.     Tenderness: There is no abdominal tenderness. There is no guarding.  Musculoskeletal:        General: Normal range of motion.     Right lower leg: No edema.     Left lower leg: No edema.  Skin:    General: Skin is dry.  Neurological:     Mental Status: She is  alert and oriented to person, place, and time.     Deep Tendon Reflexes: Reflexes normal.  Psychiatric:        Mood and Affect: Mood normal.        Behavior: Behavior normal.     BP 122/78 (BP Location: Left Arm, Patient Position: Sitting, Cuff Size: Normal)  Pulse 74   Temp (!) 97 F (36.1 C) (Temporal)   Ht 5' 0.5" (1.537 m)   Wt 163 lb 12.8 oz (74.3 kg)   SpO2 97%   BMI 31.46 kg/m  Wt Readings from Last 3 Encounters:  11/10/19 163 lb 12.8 oz (74.3 kg)  11/06/18 166 lb 3.2 oz (75.4 kg)  11/05/17 163 lb 9.6 oz (74.2 kg)     Lab Results  Component Value Date   WBC 7.2 11/06/2018   HGB 14.1 11/06/2018   HCT 42.2 11/06/2018   PLT 222.0 11/06/2018   GLUCOSE 122 (H) 11/06/2018   CHOL 173 11/06/2018   TRIG 89.0 11/06/2018   HDL 52.40 11/06/2018   LDLDIRECT 145.8 09/15/2013   LDLCALC 103 (H) 11/06/2018   ALT 18 11/06/2018   AST 17 11/06/2018   NA 140 11/06/2018   K 4.5 11/06/2018   CL 104 11/06/2018   CREATININE 0.78 11/06/2018   BUN 9 11/06/2018   CO2 30 11/06/2018   TSH 1.08 11/06/2018   HGBA1C 6.2 (A) 11/06/2018   HGBA1C 6.2 11/06/2018   MICROALBUR 2.4 (H) 11/06/2018    Lab Results  Component Value Date   TSH 1.08 11/06/2018   Lab Results  Component Value Date   WBC 7.2 11/06/2018   HGB 14.1 11/06/2018   HCT 42.2 11/06/2018   MCV 90.4 11/06/2018   PLT 222.0 11/06/2018   Lab Results  Component Value Date   NA 140 11/06/2018   K 4.5 11/06/2018   CO2 30 11/06/2018   GLUCOSE 122 (H) 11/06/2018   BUN 9 11/06/2018   CREATININE 0.78 11/06/2018   BILITOT 0.6 11/06/2018   ALKPHOS 50 11/06/2018   AST 17 11/06/2018   ALT 18 11/06/2018   PROT 6.4 11/06/2018   ALBUMIN 3.9 11/06/2018   CALCIUM 9.6 11/06/2018   GFR 75.14 11/06/2018   Lab Results  Component Value Date   CHOL 173 11/06/2018   Lab Results  Component Value Date   HDL 52.40 11/06/2018   Lab Results  Component Value Date   LDLCALC 103 (H) 11/06/2018   Lab Results  Component Value  Date   TRIG 89.0 11/06/2018   Lab Results  Component Value Date   CHOLHDL 3 11/06/2018   Lab Results  Component Value Date   HGBA1C 6.2 (A) 11/06/2018   HGBA1C 6.2 11/06/2018       Assessment & Plan:   Problem List Items Addressed This Visit      Active Problems   Hyperlipidemia    Lipids:    Component Value Date/Time   CHOL 173 11/06/2018 1314   TRIG 89.0 11/06/2018 1314   HDL 52.40 11/06/2018 1314   LDLDIRECT 145.8 09/15/2013 1205   VLDL 17.8 11/06/2018 1314   CHOLHDL 3 11/06/2018 1314     The patient does not use medications that may worsen dyslipidemias (corticosteroids, progestins, anabolic steroids, diuretics, beta-blockers, amiodarone, cyclosporine, olanzapine). The patient exercises infrequently. The patient is not known to have coexisting coronary artery disease.   Assessment:  Dyslipidemia. Target levels for LDL are: < 100 mg/dl (CHD or "CHD risk equivalent" is present).  Explained to the patient the respective contributions of genetics, diet, and exercise to lipid levels and the use of medication in severe cases which do not respond to lifestyle alteration. The patient's interest and motivation in making lifestyle changes seems good.   Plan: The following changes are planned for the next 3 months, at which time the patient will return for repeat fasting lipids:  1. Dietary changes: Increase soluble fiber. Decrease simple carbohydrates. 2. Exercise changes: An average 40 minutes of moderate to vigorous-intensity aerobic activity 3 or 4 times per week. 3. Lipid-lowering medications: simvastatin 20 mg qhs. (Recommended by NCEP after 3-6 mos of dietary therapy & lifestyle modification, except if CHD is present or LDL well above 190.) 5. Hormone replacement therapy (patient is a postmenopausal  woman): no 6. Screening for secondary causes of dyslipidemias: SEE ORDERS.        Relevant Orders   Comprehensive metabolic panel   CBC with Differential/Platelet    TSH   Lipid Profile   Diabetes mellitus type 2, diet-controlled (HCC)    Diet controlled, further diabetic ROS: no polyuria or polydipsia, no chest pain, dyspnea or TIA's, no numbness, tingling or pain in extremities. Lab Results  Component Value Date   HGBA1C 6.2 (A) 11/06/2018   HGBA1C 6.2 11/06/2018   HGBA1C 6.5 11/05/2017   Lab Results  Component Value Date   MICROALBUR 2.4 (H) 11/06/2018   LDLCALC 103 (H) 11/06/2018   CREATININE 0.78 11/06/2018    Health Maintenance 1.  Patient is counseled on appropriate foot care. 2.  BP goal < 130/80.  Not on ARB or ACE- due for urine micro. 3.  LDL goal of < 100, HDL > 40 and TG < 150. All diabetic should be on a statin unless contraindication- on statin. 4.  Eye Exam yearly and Dental Exam every 6 months. 5.  Dietary recommendations: < 100 g carbohydrates daily. 6.  Physical Activity recommendations: As tolerated aerobic and strength exercises.  7.  Appropriate vaccines reviewed.         Relevant Orders   DM- urine micro   DM- a1c   Comprehensive metabolic panel   CBC with Differential/Platelet   TSH   Lipid Profile   HTN (hypertension)    History: Review: taking medications as instructed, no medication side effects noted, no TIAs, no chest pain on exertion, no dyspnea on exertion, no swelling of ankles. Smoker: No.  BP Readings from Last 3 Encounters:  11/06/18 138/74  11/05/17 118/78  03/06/17 130/72   Lab Results  Component Value Date   CREATININE 0.78 11/06/2018     Assessment/Plan: 1. Medication: no change. 2. Dietary sodium restriction. 3. Regular aerobic exercise. 4. Check blood pressures monthly and record.       Relevant Orders   Comprehensive metabolic panel   CBC with Differential/Platelet   TSH   Lipid Profile   Osteopenia   Well woman exam without gynecological exam - Primary    Reviewed preventive care protocols, scheduled due services, and updated immunizations Discussed nutrition, exercise,  diet, and healthy lifestyle.  Mammogram scheduled for 12/17/19. Negative Cologuard 11/12/17.       Urinary incontinence, urge    History:  Has been going on for years.  Feels it is progressing.  Having to wear pads.  No burning with urination.  Has never taking taking anything for UI.  UA shows RBCs only, send for cx.  She already has dry mouth and wants to myrbetriq as it causes less dry mouth and constipation.        Relevant Medications   mirabegron ER (MYRBETRIQ) 25 MG TB24 tablet   Other Relevant Orders   POCT Urinalysis Dipstick (Completed)   Urine Culture    Other Visit Diagnoses    Hematuria, unspecified type       Relevant Orders   Urine Culture      I am  having Kayla Alvarez start on mirabegron ER. I am also having her maintain her furosemide, simvastatin, and albuterol.  Meds ordered this encounter  Medications  . mirabegron ER (MYRBETRIQ) 25 MG TB24 tablet    Sig: Take 1 tablet (25 mg total) by mouth daily.    Dispense:  30 tablet    Refill:  3    This visit occurred during the SARS-CoV-2 public health emergency.  Safety protocols were in place, including screening questions prior to the visit, additional usage of staff PPE, and extensive cleaning of exam room while observing appropriate contact time as indicated for disinfecting solutions.    Arnette Norris, MD

## 2019-11-10 ENCOUNTER — Encounter: Payer: Self-pay | Admitting: Family Medicine

## 2019-11-10 ENCOUNTER — Other Ambulatory Visit: Payer: Self-pay

## 2019-11-10 ENCOUNTER — Ambulatory Visit (INDEPENDENT_AMBULATORY_CARE_PROVIDER_SITE_OTHER): Payer: Medicare HMO | Admitting: Family Medicine

## 2019-11-10 VITALS — BP 122/78 | HR 74 | Temp 97.0°F | Ht 60.5 in | Wt 163.8 lb

## 2019-11-10 DIAGNOSIS — M8580 Other specified disorders of bone density and structure, unspecified site: Secondary | ICD-10-CM

## 2019-11-10 DIAGNOSIS — E785 Hyperlipidemia, unspecified: Secondary | ICD-10-CM | POA: Diagnosis not present

## 2019-11-10 DIAGNOSIS — I10 Essential (primary) hypertension: Secondary | ICD-10-CM

## 2019-11-10 DIAGNOSIS — R319 Hematuria, unspecified: Secondary | ICD-10-CM | POA: Diagnosis not present

## 2019-11-10 DIAGNOSIS — Z0000 Encounter for general adult medical examination without abnormal findings: Secondary | ICD-10-CM

## 2019-11-10 DIAGNOSIS — Z0001 Encounter for general adult medical examination with abnormal findings: Secondary | ICD-10-CM | POA: Diagnosis not present

## 2019-11-10 DIAGNOSIS — E119 Type 2 diabetes mellitus without complications: Secondary | ICD-10-CM | POA: Diagnosis not present

## 2019-11-10 DIAGNOSIS — N3941 Urge incontinence: Secondary | ICD-10-CM

## 2019-11-10 LAB — POCT URINALYSIS DIPSTICK
Bilirubin, UA: NEGATIVE
Glucose, UA: NEGATIVE
Ketones, UA: NEGATIVE
Leukocytes, UA: NEGATIVE
Nitrite, UA: NEGATIVE
Protein, UA: NEGATIVE
Spec Grav, UA: 1.025 (ref 1.010–1.025)
Urobilinogen, UA: 1 E.U./dL
pH, UA: 6 (ref 5.0–8.0)

## 2019-11-10 LAB — CBC WITH DIFFERENTIAL/PLATELET
Basophils Absolute: 0 10*3/uL (ref 0.0–0.1)
Basophils Relative: 0.6 % (ref 0.0–3.0)
Eosinophils Absolute: 0.2 10*3/uL (ref 0.0–0.7)
Eosinophils Relative: 2.3 % (ref 0.0–5.0)
HCT: 44.1 % (ref 36.0–46.0)
Hemoglobin: 14.6 g/dL (ref 12.0–15.0)
Lymphocytes Relative: 29.2 % (ref 12.0–46.0)
Lymphs Abs: 2.4 10*3/uL (ref 0.7–4.0)
MCHC: 33.2 g/dL (ref 30.0–36.0)
MCV: 90.5 fl (ref 78.0–100.0)
Monocytes Absolute: 0.7 10*3/uL (ref 0.1–1.0)
Monocytes Relative: 9 % (ref 3.0–12.0)
Neutro Abs: 4.8 10*3/uL (ref 1.4–7.7)
Neutrophils Relative %: 58.9 % (ref 43.0–77.0)
Platelets: 233 10*3/uL (ref 150.0–400.0)
RBC: 4.88 Mil/uL (ref 3.87–5.11)
RDW: 13.5 % (ref 11.5–15.5)
WBC: 8.2 10*3/uL (ref 4.0–10.5)

## 2019-11-10 LAB — MICROALBUMIN / CREATININE URINE RATIO
Creatinine,U: 183.5 mg/dL
Microalb Creat Ratio: 0.7 mg/g (ref 0.0–30.0)
Microalb, Ur: 1.3 mg/dL (ref 0.0–1.9)

## 2019-11-10 LAB — COMPREHENSIVE METABOLIC PANEL
ALT: 19 U/L (ref 0–35)
AST: 19 U/L (ref 0–37)
Albumin: 4.1 g/dL (ref 3.5–5.2)
Alkaline Phosphatase: 57 U/L (ref 39–117)
BUN: 11 mg/dL (ref 6–23)
CO2: 27 mEq/L (ref 19–32)
Calcium: 9.3 mg/dL (ref 8.4–10.5)
Chloride: 106 mEq/L (ref 96–112)
Creatinine, Ser: 0.84 mg/dL (ref 0.40–1.20)
GFR: 64.74 mL/min (ref >60.00)
Glucose, Bld: 139 mg/dL — ABNORMAL HIGH (ref 70–99)
Potassium: 4 mEq/L (ref 3.5–5.1)
Sodium: 140 mEq/L (ref 135–145)
Total Bilirubin: 0.5 mg/dL (ref 0.2–1.2)
Total Protein: 6.7 g/dL (ref 6.0–8.3)

## 2019-11-10 LAB — LIPID PANEL
Cholesterol: 173 mg/dL (ref 0–200)
HDL: 56.9 mg/dL (ref >39.00)
LDL Cholesterol: 100 mg/dL — ABNORMAL HIGH (ref 0–99)
NonHDL: 116.44
Total CHOL/HDL Ratio: 3
Triglycerides: 80 mg/dL (ref 0.0–149.0)
VLDL: 16 mg/dL (ref 0.0–40.0)

## 2019-11-10 LAB — TSH: TSH: 1.5 u[IU]/mL (ref 0.35–4.50)

## 2019-11-10 LAB — HEMOGLOBIN A1C: Hgb A1c MFr Bld: 6.5 % (ref 4.6–6.5)

## 2019-11-10 MED ORDER — MIRABEGRON ER 25 MG PO TB24: 25.0000 mg | ORAL_TABLET | Freq: Every day | ORAL | 3 refills | Status: DC

## 2019-11-10 NOTE — Assessment & Plan Note (Addendum)
History:  Has been going on for years.  Feels it is progressing.  Having to wear pads.  No burning with urination.  Has never taking taking anything for UI.  UA shows RBCs only, send for cx.  She already has dry mouth and wants to myrbetriq as it causes less dry mouth and constipation.

## 2019-11-10 NOTE — Patient Instructions (Addendum)
Great to see you. I will call you with your lab results from today and you can view them online.    We are starting Myrbetriq for your urinary symptoms- 25 mg daily.   We are committed to keeping you informed about the COVID-19 vaccine.  As the vaccine continues to become available for each phase, we will ensure that patients who meet the criteria receive the information they need to access vaccination opportunities. Continue to check your MyChart account and [email or web address] for updates. Please review the Phase 1b information below.  Following [place]'s guidelines for the distribution of COVID-19 vaccines, we are pleased to share our plans to begin offering vaccines to those 75 and older (Phase 1b). Here are details of those plans:  [place] [facility]-19 [facility] . Appointments required. [facility] Open to those age 82 or older . Not restricted to [facility] or [facility] residents . Location: [place], [place], [place]  . Offered daily beginning: Saturday, November 08, 2019 . Hours: 8 a.m. to 1 p.m. (10 a.m. to 2 p.m. this Saturday and Sunday, Jan. 9 and 10) . Registration for vaccine clinic appointments is open as of 10 a.m., Friday, January 8 . Please visit [email or web address] to register or call [phone] . There will be no copay required for the vaccine. Insurance information will be requested if available.  [place] will offer this vaccination clinic through Sunday, January 17, after which we will switch to a larger location to offer public vaccination at a larger scale following state guidelines. We will provide additional information as details become available.   Also, people who are 75 years and older can sign up to get vaccinated against COVID-19 at clinics that begin Monday through the [facility]. The vaccinations are open to all in that age group, regardless of their health condition  or living situation.  Appointments are required and can be made beginning at 8 a.m. Friday by calling [phone] and selecting option 2. Walk-ins will not be accepted. Clinic locations are: [facility], [place]; [facility] [address] [place]; . [place] at [facility], [address], [id number], [place].  Participants are asked to wear a face covering at vaccination sites. Visit [email or web address] and click on the "[id number] Vaccine Info" rectangle for more information about vaccinations.  In addition to the clinics above, we are working in partnership with county health agencies in [place], [place], [place] and [place] counties to ensure continuing vaccination availability in alignment with state guidelines in the weeks and months ahead.   Information on phase 1b COVID-19 vaccination clinics being offered by local county health agencies is provided in the website links below for your convenience:   . [place]  . [place]  . [place] . [place]  . [place]'s phase 1b vaccination guidelines, prioritizing those 75 and over as the next eligible group to receive the COVID-19 vaccine, are detailed at [email or web address].   Additional information: Those 75 and older who register for [facility]'s COVID-19 vaccination clinic at [facility] will be provided with additional information, including the need to be observed for 15 minutes following vaccination for your safety. Our COVID-19 testing clinic will not start at this site until 2 p.m. daily to ensure no people arriving for vaccination intersect with those seeking a COVID-19 test. [place] rooms  at the [facility] are clean and safe, and our staff will use all appropriate protective equipment to keep you safe. This vaccine clinic will  be located in a completely separate area of this facility than where health care is provided. No interaction will occur between patients or care teams at this site and our vaccination clinic.   As we proceed through phases of the vaccine rollout, we remind everyone to remain vigilant in practicing the 3 W's - wear a mask, wash your hands and wait 6 feet apart from others. These safety practices are based in science and are the best tool we have to reduce the spread of the virus.   For our most current information, please visit [email or web address].

## 2019-11-12 LAB — URINE CULTURE
MICRO NUMBER:: 10028041
SPECIMEN QUALITY:: ADEQUATE

## 2019-11-21 ENCOUNTER — Telehealth: Payer: Self-pay | Admitting: Family Medicine

## 2019-11-21 NOTE — Telephone Encounter (Signed)
Patient is calling about lab results. Patient has not heard. Please call patient.

## 2019-11-28 ENCOUNTER — Telehealth: Payer: Self-pay | Admitting: Family Medicine

## 2019-11-28 NOTE — Telephone Encounter (Signed)
Dr. Loletha Grayer, patient is requesting a TOC to you since Dr. Deborra Medina is leaving the practice. Please advise.

## 2019-12-04 ENCOUNTER — Other Ambulatory Visit: Payer: Self-pay | Admitting: Family Medicine

## 2019-12-04 DIAGNOSIS — E785 Hyperlipidemia, unspecified: Secondary | ICD-10-CM

## 2019-12-04 DIAGNOSIS — J984 Other disorders of lung: Secondary | ICD-10-CM

## 2019-12-04 DIAGNOSIS — N259 Disorder resulting from impaired renal tubular function, unspecified: Secondary | ICD-10-CM

## 2019-12-04 NOTE — Telephone Encounter (Signed)
Patient is requesting a refill for Simvastatin and Fursemie sent to [facility] on [address]. CB is [phone]

## 2019-12-04 NOTE — Telephone Encounter (Signed)
[place] w me. thanks

## 2019-12-04 NOTE — Telephone Encounter (Signed)
Is this ok?

## 2019-12-05 NOTE — Telephone Encounter (Signed)
I called and spoke to patient. Patient aware TOC has been approved to see Dr. Loletha Grayer. Patient will call back to schedule.

## 2019-12-11 DIAGNOSIS — H18513 Endothelial corneal dystrophy, bilateral: Secondary | ICD-10-CM | POA: Diagnosis not present

## 2019-12-11 DIAGNOSIS — H40051 Ocular hypertension, right eye: Secondary | ICD-10-CM | POA: Diagnosis not present

## 2019-12-11 DIAGNOSIS — H402231 Chronic angle-closure glaucoma, bilateral, mild stage: Secondary | ICD-10-CM | POA: Diagnosis not present

## 2019-12-11 DIAGNOSIS — H04123 Dry eye syndrome of bilateral lacrimal glands: Secondary | ICD-10-CM | POA: Diagnosis not present

## 2019-12-17 ENCOUNTER — Ambulatory Visit: Payer: Medicare HMO

## 2020-01-22 ENCOUNTER — Ambulatory Visit
Admission: RE | Admit: 2020-01-22 | Discharge: 2020-01-22 | Disposition: A | Discharge status: Home / Self Care | Payer: Medicare HMO | Source: Ambulatory Visit | Attending: Family Medicine | Admitting: Family Medicine

## 2020-01-22 ENCOUNTER — Other Ambulatory Visit: Payer: Self-pay

## 2020-01-22 DIAGNOSIS — Z1231 Encounter for screening mammogram for malignant neoplasm of breast: Secondary | ICD-10-CM

## 2020-01-27 ENCOUNTER — Telehealth: Payer: Self-pay | Admitting: Family Medicine

## 2020-01-27 DIAGNOSIS — J984 Other disorders of lung: Secondary | ICD-10-CM

## 2020-01-27 DIAGNOSIS — N259 Disorder resulting from impaired renal tubular function, unspecified: Secondary | ICD-10-CM

## 2020-01-27 DIAGNOSIS — R601 Generalized edema: Secondary | ICD-10-CM

## 2020-01-27 MED ORDER — FUROSEMIDE 20 MG PO TABS: 20.0000 mg | ORAL_TABLET | Freq: Every day | ORAL | 3 refills | Status: DC

## 2020-01-27 NOTE — Telephone Encounter (Signed)
Rx refilled.

## 2020-01-27 NOTE — Addendum Note (Signed)
Addended by: Ronnald Nian on: 01/27/2020 04:08 PM   Modules accepted: Orders

## 2020-01-27 NOTE — Telephone Encounter (Signed)
Kayla Alvarez is a former patient of Dr. Deborra Medina and has an upcoming TOC appt with Dr. Bryan Lemma. She states she has one Lasix pill left and the pharmacy needs refill approval from Dr. Bryan Lemma.

## 2020-02-09 NOTE — Patient Instructions (Signed)
There are no preventive care reminders to display for this patient.  Depression screen [facility] 2/9 11/10/2019 11/05/2017 10/04/2016  Decreased Interest 0 0 0  Down, Depressed, Hopeless 0 0 0  PHQ - 2 Score 0 0 0

## 2020-02-10 ENCOUNTER — Other Ambulatory Visit: Payer: Self-pay

## 2020-02-11 ENCOUNTER — Ambulatory Visit (INDEPENDENT_AMBULATORY_CARE_PROVIDER_SITE_OTHER): Payer: Medicare HMO | Admitting: Family Medicine

## 2020-02-11 ENCOUNTER — Encounter: Payer: Self-pay | Admitting: Family Medicine

## 2020-02-11 VITALS — BP 130/72 | HR 71 | Temp 98.4°F | Ht 60.5 in | Wt 163.6 lb

## 2020-02-11 DIAGNOSIS — M25461 Effusion, right knee: Secondary | ICD-10-CM | POA: Diagnosis not present

## 2020-02-11 DIAGNOSIS — E785 Hyperlipidemia, unspecified: Secondary | ICD-10-CM

## 2020-02-11 DIAGNOSIS — E119 Type 2 diabetes mellitus without complications: Secondary | ICD-10-CM | POA: Diagnosis not present

## 2020-02-11 DIAGNOSIS — I10 Essential (primary) hypertension: Secondary | ICD-10-CM | POA: Diagnosis not present

## 2020-02-11 NOTE — Progress Notes (Signed)
Kayla Alvarez is a 86 y.o. female  Chief Complaint  Patient presents with  . Establish Care    Pt c/o Rt knee swelling.  Pt said it feels swollen, noticing 2-3weeks ago.    HPI: Kayla Alvarez is a 86 y.o. female here for [facility] app, previous PCP Dr. Deborra Medina. She complains of Rt knee swelling - "pt states it feels like is it swollen" - x 2-3 wks. She states she went to stand up 2-3 wks ago and her Rt knee felt like it would like give out. No fall. Happened only that time. No pain. No issue with ambulation. No bruising or discoloration. No injury. No chronic knee issues/pain.    Past Medical History:  Diagnosis Date  . ALLERGIC RHINITIS 06/05/2007  . BREAST LUMP 05/19/2010  . CONTACT DERMATITIS&OTH ECZEMA DUE OTH [place] AGENT 02/02/2010  . Edema 07/19/2009  . Glaucoma   . HERPES ZOSTER 11/28/2009  . HYPERLIPIDEMIA 06/05/2007  . PULMONARY NODULE, LEFT UPPER LOBE 07/26/2009  . RENAL INSUFFICIENCY 06/05/2007  . UTI 09/03/2008  . VAGINITIS, ATROPHIC 06/26/2007  . Venous insufficiency     Past Surgical History:  Procedure Laterality Date  . CESAREAN SECTION    . EYE SURGERY    . HEMORRHOID SURGERY    . TOOTH EXTRACTION      Social History   Socioeconomic History  . Marital status: Married    Spouse name: Not on file  . Number of children: Not on file  . Years of education: Not on file  . Highest education level: Not on file  Occupational History  . Not on file  Tobacco Use  . Smoking status: Former Research scientist (life sciences)  . Smokeless tobacco: Never Used  Substance and Sexual Activity  . Alcohol use: No  . Drug use: No  . Sexual activity: Never    Comment: 20+ years  Other Topics Concern  . Not on file  Social History Narrative   Desires CPR and life support if not futile   Social Determinants of Health   Financial Resource Strain:   . Difficulty of Paying Living Expenses:   Food Insecurity:   . Worried About Charity fundraiser in the Last Year:   . Arboriculturist in the Last Year:     Transportation Needs:   . Film/video editor (Medical):   Marland Kitchen Lack of Transportation (Non-Medical):   Physical Activity:   . Days of Exercise per Week:   . Minutes of Exercise per Session:   Stress:   . Feeling of Stress :   Social Connections:   . Frequency of Communication with Friends and Family:   . Frequency of Social Gatherings with Friends and Family:   . Attends Religious Services:   . Active Member of Clubs or Organizations:   . Attends Archivist Meetings:   Marland Kitchen Marital Status:   Intimate Partner Violence:   . Fear of Current or Ex-Partner:   . Emotionally Abused:   Marland Kitchen Physically Abused:   . Sexually Abused:     Family History  Problem Relation Age of Onset  . Hypertension Mother   . Stroke Mother   . Heart disease Father   . Hypertension Father   . Breast cancer Sister        unsure of age  . Hypertension Sister      Immunization History  Administered Date(s) Administered  . Influenza Whole 10/30/2004  . Pneumococcal Polysaccharide-23 10/30/2001, 06/26/2007  . Td 10/30/1998,  07/19/2009    Outpatient Encounter Medications as of 02/11/2020  Medication Sig  . albuterol (PROVENTIL HFA;VENTOLIN HFA) 108 (90 Base) MCG/ACT inhaler Inhale 2 puffs into the lungs every 6 (six) hours as needed.  . furosemide (LASIX) 20 MG tablet Take 1 tablet (20 mg total) by mouth daily.  . mirabegron ER (MYRBETRIQ) 25 MG TB24 tablet Take 1 tablet (25 mg total) by mouth daily.  . simvastatin (ZOCOR) 20 MG tablet TAKE 1 TABLET BY MOUTH AT BEDTIME   No facility-administered encounter medications on file as of 02/11/2020.     ROS: Pertinent positives and negatives noted in HPI. Remainder of ROS non-contributory  No Known Allergies  BP 130/72 (BP Location: Right Arm)   Pulse 71   Temp 98.4 F (36.9 C) (Temporal)   Ht 5' 0.5" (1.537 m)   Wt 163 lb 9.6 oz (74.2 kg)   SpO2 98%   BMI 31.43 kg/m   BP Readings from Last 3 Encounters:  02/11/20 130/72  11/10/19 122/78   11/06/18 138/74     Physical Exam  Constitutional: She is oriented to person, place, and time. She appears well-developed and well-nourished. No distress.  Musculoskeletal:     Right knee: Swelling (trace edema just superior to medial joint line) present. No effusion, erythema or ecchymosis. Normal range of motion. No tenderness. No LCL laxity.  Neurological: She is alert and oriented to person, place, and time.  Skin: Skin is warm and dry.  Psychiatric: She has a normal mood and affect. Her behavior is normal.     A/P:  1. Essential hypertension - controlled, at goal - BMP done in 10/2019  2. Hyperlipidemia, unspecified hyperlipidemia type - controlled, on zocor 20mg  daily - last FLP in 10/2019  3. Diabetes mellitus type 2, diet-controlled ([place]) - last A1C in 10/2019 (6.5), will plan to recheck in 04/2020 - not on any medication  4. Swelling of right knee joint - minimal edema just superior to medial joint line of Rt knee, otherwise normal exam - ice, NSAID PRN w/ food - f/u PRN   This visit occurred during the SARS-CoV-2 public health emergency.  Safety protocols were in place, including screening questions prior to the visit, additional usage of staff PPE, and extensive cleaning of exam room while observing appropriate contact time as indicated for disinfecting solutions.

## 2020-03-07 ENCOUNTER — Emergency Department (HOSPITAL_COMMUNITY)
Admission: EM | Admit: 2020-03-07 | Discharge: 2020-03-07 | Disposition: A | Discharge status: Home / Self Care | Payer: Medicare HMO | Attending: Emergency Medicine | Admitting: Emergency Medicine

## 2020-03-07 ENCOUNTER — Other Ambulatory Visit: Payer: Self-pay

## 2020-03-07 ENCOUNTER — Encounter (HOSPITAL_COMMUNITY): Payer: Self-pay

## 2020-03-07 ENCOUNTER — Emergency Department (HOSPITAL_COMMUNITY): Payer: Medicare HMO

## 2020-03-07 DIAGNOSIS — I10 Essential (primary) hypertension: Secondary | ICD-10-CM | POA: Insufficient documentation

## 2020-03-07 DIAGNOSIS — R0602 Shortness of breath: Secondary | ICD-10-CM

## 2020-03-07 DIAGNOSIS — R5383 Other fatigue: Secondary | ICD-10-CM | POA: Diagnosis not present

## 2020-03-07 DIAGNOSIS — R05 Cough: Secondary | ICD-10-CM | POA: Insufficient documentation

## 2020-03-07 DIAGNOSIS — Z79899 Other long term (current) drug therapy: Secondary | ICD-10-CM | POA: Insufficient documentation

## 2020-03-07 DIAGNOSIS — R0600 Dyspnea, unspecified: Secondary | ICD-10-CM | POA: Insufficient documentation

## 2020-03-07 DIAGNOSIS — U071 COVID-19: Secondary | ICD-10-CM | POA: Diagnosis not present

## 2020-03-07 DIAGNOSIS — R110 Nausea: Secondary | ICD-10-CM | POA: Insufficient documentation

## 2020-03-07 LAB — I-STAT CHEM 8, ED
BUN: 10 mg/dL (ref 8–23)
Calcium, Ion: 1.12 mmol/L — ABNORMAL LOW (ref 1.15–1.40)
Chloride: 99 mmol/L (ref 98–111)
Creatinine, Ser: 0.8 mg/dL (ref 0.44–1.00)
Glucose, Bld: 136 mg/dL — ABNORMAL HIGH (ref 70–99)
HCT: 43 % (ref 36.0–46.0)
Hemoglobin: 14.6 g/dL (ref 12.0–15.0)
Potassium: 3.8 mmol/L (ref 3.5–5.1)
Sodium: 136 mmol/L (ref 135–145)
TCO2: 29 mmol/L (ref 22–32)

## 2020-03-07 NOTE — ED Provider Notes (Signed)
[place] DEPT Provider Note   CSN: [id number] Arrival date & time: 03/07/20  1222     History Chief Complaint  Patient presents with  . COVID POSITIVE  . Fatigue    Kayla Alvarez is a 86 y.o. female.  HPI   Patient presents from urgent care after being referred here following positive coronavirus test result. Patient states that she is generally well, was so until about 1 week ago.  About the time she developed mild nausea, anorexia, fatigue.  There is some cough, dyspnea, but she attributes this to her fatigue. No pain focally anywhere, no fever, no confusion. No vomiting, no diarrhea. With worsening symptoms in spite of OTC cough and cold medication she went to urgent care.  There she was informed that she had a positive coronavirus test, and was encouraged to go to the emergency department for evaluation. Per report patient may have had some hypoxia at urgent care.  Past Medical History:  Diagnosis Date  . ALLERGIC RHINITIS 06/05/2007  . BREAST LUMP 05/19/2010  . CONTACT DERMATITIS&OTH ECZEMA DUE OTH [place] AGENT 02/02/2010  . Edema 07/19/2009  . Glaucoma   . HERPES ZOSTER 11/28/2009  . HYPERLIPIDEMIA 06/05/2007  . PULMONARY NODULE, LEFT UPPER LOBE 07/26/2009  . RENAL INSUFFICIENCY 06/05/2007  . UTI 09/03/2008  . VAGINITIS, ATROPHIC 06/26/2007  . Venous insufficiency     Patient Active Problem List   Diagnosis Date Noted  . Urinary incontinence, urge 11/10/2019  . Well woman exam without gynecological exam 11/08/2019  . Osteopenia 11/06/2018  . Diabetes mellitus type 2, diet-controlled ([place]) 11/11/2015  . HTN (hypertension) 11/11/2015  . Retinal hemorrhage of both eyes 10/14/2015  . Asthma, chronic 06/07/2015  . VAGINITIS, ATROPHIC 06/26/2007  . Hyperlipidemia 06/05/2007  . Allergic rhinitis 06/05/2007    Past Surgical History:  Procedure Laterality Date  . CESAREAN SECTION    . EYE SURGERY    . HEMORRHOID SURGERY    . TOOTH EXTRACTION         OB History   No obstetric history on file.     Family History  Problem Relation Age of Onset  . Hypertension Mother   . Stroke Mother   . Heart disease Father   . Hypertension Father   . Breast cancer Sister        unsure of age  . Hypertension Sister     Social History   Tobacco Use  . Smoking status: Former Research scientist (life sciences)  . Smokeless tobacco: Never Used  Substance Use Topics  . Alcohol use: No  . Drug use: No    Home Medications Prior to Admission medications   Medication Sig Start Date End Date Taking? Authorizing Provider  albuterol (PROVENTIL HFA;VENTOLIN HFA) 108 (90 Base) MCG/ACT inhaler Inhale 2 puffs into the lungs every 6 (six) hours as needed. 11/06/18  Yes Lucille Passy, MD  furosemide (LASIX) 20 MG tablet Take 1 tablet (20 mg total) by mouth daily. 01/27/20  Yes Cirigliano, Mary K, DO  mirabegron ER (MYRBETRIQ) 25 MG TB24 tablet Take 1 tablet (25 mg total) by mouth daily. 11/10/19  Yes Lucille Passy, MD  simvastatin (ZOCOR) 20 MG tablet TAKE 1 TABLET BY MOUTH AT BEDTIME Patient taking differently: Take 20 mg by mouth at bedtime.  12/04/19  Yes Lucille Passy, MD    Allergies    Patient has no known allergies.  Review of Systems   Review of Systems  Constitutional:       Per  HPI, otherwise negative  HENT:       Per HPI, otherwise negative  Respiratory:       Per HPI, otherwise negative  Cardiovascular:       Per HPI, otherwise negative  Gastrointestinal: Positive for nausea. Negative for abdominal pain and vomiting.  Endocrine:       Negative aside from HPI  Genitourinary:       Neg aside from HPI   Musculoskeletal:       Per HPI, otherwise negative  Skin: Negative.   Allergic/Immunologic: Negative for immunocompromised state.  Neurological: Positive for weakness. Negative for syncope.    Physical Exam Updated Vital Signs BP (!) 160/77 (BP Location: Left Arm)   Pulse 79   Temp 99.3 F (37.4 C) (Oral)   Resp 20   SpO2 92%   Physical  Exam Vitals and nursing note reviewed.  Constitutional:      General: She is not in acute distress.    Appearance: She is well-developed.  HENT:     Head: Normocephalic and atraumatic.  Eyes:     Conjunctiva/sclera: Conjunctivae normal.  Cardiovascular:     Rate and Rhythm: Normal rate and regular rhythm.  Pulmonary:     Effort: Pulmonary effort is normal. No respiratory distress.     Breath sounds: Normal breath sounds. No stridor. No wheezing, rhonchi or rales.  Abdominal:     General: There is no distension.  Skin:    General: Skin is warm and dry.  Neurological:     Mental Status: She is alert and oriented to person, place, and time.     Cranial Nerves: No cranial nerve deficit.     ED Results / Procedures / Treatments   Labs (all labs ordered are listed, but only abnormal results are displayed) Labs Reviewed  I-STAT CHEM 8, ED - Abnormal; Notable for the following components:      Result Value   Glucose, Bld 136 (*)    Calcium, Ion 1.12 (*)    All other components within normal limits  I-STAT BETA HCG BLOOD, ED ([facility], [facility], [facility] ONLY) - Abnormal; Notable for the following components:   I-stat hCG, quantitative 5.3 (*)    All other components within normal limits    EKG EKG Interpretation  Date/Time:  Sunday Mar 07 2020 12:39:09 EDT Ventricular Rate:  77 PR Interval:    QRS Duration: 83 QT Interval:  385 QTC Calculation: 436 R Axis:   61 Text Interpretation: Sinus rhythm Artifact Otherwise within normal limits Confirmed by Carmin Muskrat [phone]) on 03/07/2020 1:46:04 PM   Radiology DG Chest Port 1 View  Result Date: 03/07/2020 CLINICAL DATA:  Cough and shortness of breath for 9 days. EXAM: PORTABLE CHEST 1 VIEW COMPARISON:  Chest radiograph dated 11/06/2018 FINDINGS: The heart size is borderline enlarged. Vascular calcifications are seen in the aortic arch. Both lungs are clear. The visualized skeletal structures are unremarkable. IMPRESSION: No active  cardiopulmonary disease. Electronically Signed   By: Zerita Boers M.D.   On: 03/07/2020 13:20    Procedures Procedures (including critical care time)  Medications Ordered in ED Medications - No data to display  ED Course  I have reviewed the triage vital signs and the nursing notes.  Pertinent labs & imaging results that were available during my care of the patient were reviewed by me and considered in my medical decision making (see chart for details).  1:52 PM On repeat exam the patient is awake, alert, in no distress, sitting  upright, speaking clearly, with no increased work of breathing.  Line oxygen saturation has been variable, 92/96%.  This is appropriate for known coronavirus infection.  X-ray does not demonstrate concurrent pneumonia, EKG does not demonstrate new arrhythmia, or ischemia, labs not demonstrate substantial electrolyte abnormalities.  Given these reassuring findings, absence of distress, hemodynamic instability, after conversation on return precautions and home follow-up she was discharged in stable condition. Final Clinical Impression(s) / ED Diagnoses Final diagnoses:  COVID-19     Carmin Muskrat, MD 03/07/20 1354

## 2020-03-07 NOTE — Discharge Instructions (Signed)
As discussed, your evaluation today has been largely reassuring.  But, it is important that you monitor your condition carefully, and do not hesitate to return to the ED if you develop new, or concerning changes in your condition. ? ?Otherwise, please follow-up with your physician for appropriate ongoing care. ? ?

## 2020-03-07 NOTE — ED Triage Notes (Signed)
Patient reports covid sx since last Friday.   Patient went to UC today had positive covid from rapid test per daughter-in-law   C/O weakness and intermittent dry cough C/O shob   Patient reports nausea this morning but denies emesis or nausea at this time.   Per daughter-in-law patient 02 dropped from 95 to 91 at UC.

## 2020-03-08 LAB — I-STAT BETA HCG BLOOD, ED (MC, WL, AP ONLY): I-stat hCG, quantitative: 5.3 m[IU]/mL — ABNORMAL HIGH (ref <5)

## 2020-04-19 ENCOUNTER — Telehealth: Payer: Self-pay | Admitting: Family Medicine

## 2020-04-19 NOTE — Telephone Encounter (Signed)
Left message for patient to schedule Annual Wellness Visit.  Please schedule with Nurse Health Advisor Martha Stanley, RN at [facility]  °

## 2020-05-04 NOTE — Progress Notes (Signed)
Subjective:   Kayla Alvarez is a 86 y.o. female who presents for an Initial Medicare Annual Wellness Visit.  I connected with Kayla Alvarez today by telephone and verified that I am speaking with the correct person using two identifiers. Location patient: home Location provider: work Persons participating in the virtual visit: patient, Marine scientist.    I discussed the limitations, risks, security and privacy concerns of performing an evaluation and management service by telephone and the availability of in person appointments. I also discussed with the patient that there may be a patient responsible charge related to this service. The patient expressed understanding and verbally consented to this telephonic visit.    Interactive audio and video telecommunications were attempted between this provider and patient, however failed, due to patient having technical difficulties OR patient did not have access to video capability.  We continued and completed visit with audio only.  Some vital signs may be absent or patient reported.   Time Spent with patient on telephone encounter: 25 minutes  Review of Systems      Cardiac Risk Factors include: advanced age (>71men, >70 women);dyslipidemia;sedentary lifestyle;obesity (BMI >30kg/m2)     Objective:    Today's Vitals   05/05/20 1244  Weight: 163 lb (73.9 kg)  Height: 5' 0.5" (1.537 m)   Body mass index is 31.31 kg/m.  Advanced Directives 05/05/2020 03/07/2020  Does Patient Have a Medical Advance Directive? No No  Would patient like information on creating a medical advance directive? No - Patient declined No - Patient declined    Current Medications (verified) Outpatient Encounter Medications as of 05/05/2020  Medication Sig  . albuterol (PROVENTIL HFA;VENTOLIN HFA) 108 (90 Base) MCG/ACT inhaler Inhale 2 puffs into the lungs every 6 (six) hours as needed.  . furosemide (LASIX) 20 MG tablet Take 1 tablet (20 mg total) by mouth daily.  . mirabegron ER  (MYRBETRIQ) 25 MG TB24 tablet Take 1 tablet (25 mg total) by mouth daily.  . simvastatin (ZOCOR) 20 MG tablet TAKE 1 TABLET BY MOUTH AT BEDTIME (Patient taking differently: Take 20 mg by mouth at bedtime. )   No facility-administered encounter medications on file as of 05/05/2020.    Allergies (verified) Patient has no known allergies.   History: Past Medical History:  Diagnosis Date  . ALLERGIC RHINITIS 06/05/2007  . BREAST LUMP 05/19/2010  . CONTACT DERMATITIS&OTH ECZEMA DUE OTH [place] AGENT 02/02/2010  . Edema 07/19/2009  . Glaucoma   . HERPES ZOSTER 11/28/2009  . HYPERLIPIDEMIA 06/05/2007  . PULMONARY NODULE, LEFT UPPER LOBE 07/26/2009  . RENAL INSUFFICIENCY 06/05/2007  . UTI 09/03/2008  . VAGINITIS, ATROPHIC 06/26/2007  . Venous insufficiency    Past Surgical History:  Procedure Laterality Date  . CESAREAN SECTION    . EYE SURGERY    . HEMORRHOID SURGERY    . TOOTH EXTRACTION     Family History  Problem Relation Age of Onset  . Hypertension Mother   . Stroke Mother   . Heart disease Father   . Hypertension Father   . Breast cancer Sister        unsure of age  . Hypertension Sister    Social History   Socioeconomic History  . Marital status: Widowed    Spouse name: Not on file  . Number of children: Not on file  . Years of education: Not on file  . Highest education level: Not on file  Occupational History  . Not on file  Tobacco Use  . Smoking status:  Former Smoker  . Smokeless tobacco: Never Used  Substance and Sexual Activity  . Alcohol use: No  . Drug use: No  . Sexual activity: Never    Comment: 20+ years  Other Topics Concern  . Not on file  Social History Narrative   Desires CPR and life support if not futile   Social Determinants of Health   Financial Resource Strain: Low Risk   . Difficulty of Paying Living Expenses: Not hard at all  Food Insecurity: No Food Insecurity  . Worried About Charity fundraiser in the Last Year: Never true  . Ran Out of  Food in the Last Year: Never true  Transportation Needs: No Transportation Needs  . Lack of Transportation (Medical): No  . Lack of Transportation (Non-Medical): No  Physical Activity: Inactive  . Days of Exercise per Week: 0 days  . Minutes of Exercise per Session: 0 min  Stress: No Stress Concern Present  . Feeling of Stress : Only a little  Social Connections: Moderately Integrated  . Frequency of Communication with Friends and Family: More than three times a week  . Frequency of Social Gatherings with Friends and Family: Twice a week  . Attends Religious Services: More than 4 times per year  . Active Member of Clubs or Organizations: Yes  . Attends Archivist Meetings: More than 4 times per year  . Marital Status: Widowed    Tobacco Counseling Counseling given: Not Answered   Clinical Intake:  Pre-visit preparation completed: Yes  Pain : No/denies pain     Nutritional Status: BMI > 30  Obese Nutritional Risks: None Diabetes: Yes CBG done?: No Did pt. bring in CBG monitor from home?: No  How often do you need to have someone help you when you read instructions, pamphlets, or other written materials from your doctor or pharmacy?: 1 - Never  Nutrition Risk Assessment:  Has the patient had any N/V/D within the last 2 months?  No  Does the patient have any non-healing wounds?  No  Has the patient had any unintentional weight loss or weight gain?  No   Diabetes:  Is the patient diabetic?  Yes  If diabetic, was a CBG obtained today?  No  Did the patient bring in their glucometer from home?  No virtual visit How often do you monitor your CBG's? never.   Financial Strains and Diabetes Management:  Are you having any financial strains with the device, your supplies or your medication? No .  Does the patient want to be seen by Chronic Care Management for management of their diabetes?  No  Would the patient like to be referred to a Nutritionist or for Diabetic  Management?  No   Diabetic Exams:  Diabetic Eye Exam: Completed 09/18/2019.   Diabetic Foot Exam: Completed 11/10/2019.   Interpreter Needed?: No  Information entered by :: Lesly Dukes LPN   Activities of Daily Living In your present state of health, do you have any difficulty performing the following activities: 05/05/2020 11/10/2019  Hearing? N Y  Vision? N N  Difficulty concentrating or making decisions? Y Y  Comment occasionally forgets things -  Walking or climbing stairs? N N  Dressing or bathing? N N  Doing errands, shopping? N N  Preparing Food and eating ? N -  Using the Toilet? N -  In the past six months, have you accidently leaked urine? N -  Do you have problems with loss of bowel control? N -  Managing your Medications? N -  Managing your Finances? N -  Housekeeping or managing your Housekeeping? N -  Some recent data might be hidden     Immunizations and Health Maintenance Immunization History  Administered Date(s) Administered  . Influenza Whole 10/30/2004  . Pneumococcal Polysaccharide-23 10/30/2001, 06/26/2007  . Td 10/30/1998, 07/19/2009   There are no preventive care reminders to display for this patient.  Patient Care Team: Ronnald Nian, DO as PCP - General (Family Medicine) Sable Feil, MD as Consulting Physician (Gastroenterology) Rolm Bookbinder, MD as Consulting Physician (Dermatology) Monna Fam, MD as Consulting Physician (Ophthalmology) Monna Fam, MD as Consulting Physician (Ophthalmology)  Indicate any recent Medical Services you may have received from other than [facility] providers in the past year (date may be approximate).     Assessment:   This is a routine wellness examination for Nelda.  Hearing/Vision screen  Hearing Screening   125Hz  250Hz  500Hz  1000Hz  2000Hz  3000Hz  4000Hz  6000Hz  8000Hz   Right ear:           Left ear:           Comments: Hearing aids   Vision Screening Comments: Reading glasses Last eye  exam 09/18/2019  Dietary issues and exercise activities discussed: Current Exercise Habits: The patient does not participate in regular exercise at present  Goals Addressed            This Visit's Progress   . Patient Stated       Would like to lose some weight by watching diet & increasing activity      Depression Screen PHQ 2/9 Scores 05/05/2020 11/10/2019 11/05/2017 10/04/2016 09/22/2015 09/16/2014 09/15/2013  PHQ - 2 Score 0 0 0 0 0 0 1    Fall Risk Fall Risk  05/05/2020 11/10/2019 11/06/2018 11/05/2017 10/04/2016  Falls in the past year? 0 0 0 No No  Number falls in past yr: 0 - - - -  Injury with Fall? 0 - - - -  Follow up Falls prevention discussed - Falls evaluation completed - -    FALL RISK PREVENTION PERTAINING TO THE HOME:  Any stairs in or around the home? No    Home free of loose throw rugs in walkways, pet beds, electrical cords, etc? Yes  Adequate lighting in your home to reduce risk of falls? Yes   ASSISTIVE DEVICES UTILIZED TO PREVENT FALLS:  Life alert? No  Use of a cane, walker or w/c? No  Grab bars in the bathroom? No  Shower chair or bench in shower? No  Elevated toilet seat or a handicapped toilet? No    TIMED UP AND GO:  Was the test performed? No . Virtual visit    Cognitive Function:     6CIT Screen 05/05/2020  What Year? 0 points  What month? 0 points  What time? 0 points  Count back from 20 0 points  Months in reverse 0 points  Repeat phrase 2 points  Total Score 2    Screening Tests Health Maintenance  Topic Date Due  . COVID-19 Vaccine (1) 05/21/2020 (Originally 10/10/1946)  . TETANUS/TDAP  11/09/2020 (Originally 07/20/2019)  . PNA vac Low Risk Adult (2 of 2 - PCV13) 11/09/2020 (Originally 06/25/2008)  . HEMOGLOBIN A1C  05/09/2020  . OPHTHALMOLOGY EXAM  09/17/2020  . FOOT EXAM  11/09/2020  . URINE MICROALBUMIN  11/09/2020  . DEXA SCAN  Completed  . INFLUENZA VACCINE  Discontinued    Qualifies for Shingles Vaccine? Yes  Zostavax  completed no.  Due for Shingrix. Education has been provided regarding the importance of this vaccine. Pt has been advised to call insurance company to determine out of pocket expense. Advised may also receive vaccine at local pharmacy or Health Dept. Verbalized acceptance and understanding.  Tdap: Although this vaccine is not a covered service during a Wellness Exam, does the patient still wish to receive this vaccine today?  No .  Education has been provided regarding the importance of this vaccine. Advised may receive this vaccine at local pharmacy or Health Dept. Aware to provide a copy of the vaccination record if obtained from local pharmacy or Health Dept. Verbalized acceptance and understanding.  Flu Vaccine: Due 06/2020  Pneumococcal Vaccine: Due for Prevnar 13-Discuss with PCP at next office visit  COVID-19 vaccine: Information provided   Cancer Screenings:  Colorectal Screening:No longer required.  Mammogram: Completed 01/22/2020 No longer required.   Bone Density: Completed 12/27/1999 .Patient declined at this time  Lung Cancer Screening: (Low Dose CT Chest recommended if Age 92-80 years, 30 pack-year currently smoking OR have quit w/in 15years.) does not qualify.    Additional Screening:  Hepatitis C Screening: does not qualify   Dental Screening: Recommended annual dental exams for proper oral hygiene  Community Resource Referral:  CRR required this visit?  No       Plan:  I have personally reviewed and addressed the Medicare Annual Wellness questionnaire and have noted the following in the patient's chart:  A. Medical and social history B. Use of alcohol, tobacco or illicit drugs  C. Current medications and supplements D. Functional ability and status E.  Nutritional status F.  Physical activity G. Advance directives H. List of other physicians I.  Hospitalizations, surgeries, and ER visits in previous 12 months J.  [place] such as hearing and  vision if needed, cognitive and depression L. Referrals and appointments   In addition, I have reviewed and discussed with patient certain preventive protocols, quality metrics, and best practice recommendations. A written personalized care plan for preventive services as well as general preventive health recommendations were provided to patient.  Due to this being a telephonic visit, the after visit summary with patients personalized plan was offered to patient via mail or my-chart.   Per request, patient was mailed a copy of AVS.   Signed,    Marta Antu, LPN   01/05/4535  Nurse Health Advisor    Nurse Notes: None

## 2020-05-05 ENCOUNTER — Ambulatory Visit (INDEPENDENT_AMBULATORY_CARE_PROVIDER_SITE_OTHER): Payer: Medicare HMO

## 2020-05-05 VITALS — Ht 60.5 in | Wt 163.0 lb

## 2020-05-05 DIAGNOSIS — Z0000 Encounter for general adult medical examination without abnormal findings: Secondary | ICD-10-CM

## 2020-05-05 NOTE — Patient Instructions (Addendum)
[name] , Thank you for taking time to come for your Medicare Wellness Visit. I appreciate your ongoing commitment to your health goals. Please review the following plan we discussed and let me know if I can assist you in the future.   Screening recommendations/referrals: Colonoscopy: No longer indicated Mammogram: Completed 01/22/2020-Due 01/21/2021 Bone Density: Completed 2001-Declined at this time. Please call the office to schedule if you change your mind. Recommended yearly ophthalmology/optometry visit for glaucoma screening and checkup Recommended yearly dental visit for hygiene and checkup  Vaccinations: Influenza vaccine: Due 06/2020 Pneumococcal vaccine: [name] [name]  Due-Discuss with PCP at next office visit Tdap vaccine: Discuss with pharmacy Shingles vaccine: Discuss with pharmacy   Covid-19:Discussed. Declined vaccines  Advanced directives: Discussed. Declined at this time.  Conditions/risks identified: See problem list  Next appointment: Follow up in one year for your annual wellness visit 05/11/21 @ 12:45   Preventive Care 65 Years and Older, Female Preventive care refers to lifestyle choices and visits with your health care provider that can promote health and wellness. What does preventive care include?  A yearly physical exam. This is also called an annual well check.  Dental exams once or twice a year.  Routine eye exams. Ask your health care provider how often you should have your eyes checked.  Personal lifestyle choices, including:  Daily care of your teeth and gums.  Regular physical activity.  Eating a healthy diet.  Avoiding tobacco and drug use.  Limiting alcohol use.  Practicing safe sex.  Taking low-dose aspirin every day.  Taking vitamin and mineral supplements as recommended by your health care provider. What happens during an annual well check? The services and screenings done by your health care provider during your annual well check  will depend on your age, overall health, lifestyle risk factors, and family history of disease. Counseling  Your health care provider may ask you questions about your:  Alcohol use.  Tobacco use.  Drug use.  Emotional well-being.  Home and relationship well-being.  Sexual activity.  Eating habits.  History of falls.  Memory and ability to understand (cognition).  Work and work Statistician.  Reproductive health. Screening  You may have the following tests or measurements:  Height, weight, and BMI.  Blood pressure.  Lipid and cholesterol levels. These may be checked every 5 years, or more frequently if you are over 95 years old.  Skin check.  Lung cancer screening. You may have this screening every year starting at age 44 if you have a 30-pack-year history of smoking and currently smoke or have quit within the past 15 years.  Fecal occult blood test (FOBT) of the stool. You may have this test every year starting at age 77.  Flexible sigmoidoscopy or colonoscopy. You may have a sigmoidoscopy every 5 years or a colonoscopy every 10 years starting at age 55.  Hepatitis C blood test.  Hepatitis B blood test.  Sexually transmitted disease (STD) testing.  Diabetes screening. This is done by checking your blood sugar (glucose) after you have not eaten for a while (fasting). You may have this done every 1-3 years.  Bone density scan. This is done to screen for osteoporosis. You may have this done starting at age 34.  Mammogram. This may be done every 1-2 years. Talk to your health care provider about how often you should have regular mammograms. Talk with your health care provider about your test results, treatment options, and if necessary, the need for more tests. Vaccines  Your health care provider may recommend certain vaccines, such as:  Influenza vaccine. This is recommended every year.  Tetanus, diphtheria, and acellular pertussis (Tdap, Td) vaccine. You may  need a Td booster every 10 years.  Zoster vaccine. You may need this after age 58.  Pneumococcal 13-valent conjugate (PCV13) vaccine. One dose is recommended after age 26.  Pneumococcal polysaccharide (PPSV23) vaccine. One dose is recommended after age 40. Talk to your health care provider about which screenings and vaccines you need and how often you need them. This information is not intended to replace advice given to you by your health care provider. Make sure you discuss any questions you have with your health care provider. Document Released: 11/12/2015 Document Revised: 07/05/2016 Document Reviewed: 08/17/2015 Elsevier Interactive Patient Education  2017 [place] Prevention in the Home Falls can cause injuries. They can happen to people of all ages. There are many things you can do to make your home safe and to help prevent falls. What can I do on the outside of my home?  Regularly fix the edges of walkways and driveways and fix any cracks.  Remove anything that might make you trip as you walk through a door, such as a raised step or threshold.  Trim any bushes or trees on the path to your home.  Use bright outdoor lighting.  Clear any walking paths of anything that might make someone trip, such as rocks or tools.  Regularly check to see if handrails are loose or broken. Make sure that both sides of any steps have handrails.  Any raised decks and porches should have guardrails on the edges.  Have any leaves, snow, or ice cleared regularly.  Use sand or salt on walking paths during winter.  Clean up any spills in your garage right away. This includes oil or grease spills. What can I do in the bathroom?  Use night lights.  Install grab bars by the toilet and in the tub and shower. Do not use towel bars as grab bars.  Use non-skid mats or decals in the tub or shower.  If you need to sit down in the shower, use a plastic, non-slip stool.  Keep the floor  dry. Clean up any water that spills on the floor as soon as it happens.  Remove soap buildup in the tub or shower regularly.  Attach bath mats securely with double-sided non-slip rug tape.  Do not have throw rugs and other things on the floor that can make you trip. What can I do in the bedroom?  Use night lights.  Make sure that you have a light by your bed that is easy to reach.  Do not use any sheets or blankets that are too big for your bed. They should not hang down onto the floor.  Have a firm chair that has side arms. You can use this for support while you get dressed.  Do not have throw rugs and other things on the floor that can make you trip. What can I do in the kitchen?  Clean up any spills right away.  Avoid walking on wet floors.  Keep items that you use a lot in easy-to-reach places.  If you need to reach something above you, use a strong step stool that has a grab bar.  Keep electrical cords out of the way.  Do not use floor polish or wax that makes floors slippery. If you must use wax, use non-skid floor wax.  Do  not have throw rugs and other things on the floor that can make you trip. What can I do with my stairs?  Do not leave any items on the stairs.  Make sure that there are handrails on both sides of the stairs and use them. Fix handrails that are broken or loose. Make sure that handrails are as long as the stairways.  Check any carpeting to make sure that it is firmly attached to the stairs. Fix any carpet that is loose or worn.  Avoid having throw rugs at the top or bottom of the stairs. If you do have throw rugs, attach them to the floor with carpet tape.  Make sure that you have a light switch at the top of the stairs and the bottom of the stairs. If you do not have them, ask someone to add them for you. What else can I do to help prevent falls?  Wear shoes that:  Do not have high heels.  Have rubber bottoms.  Are comfortable and fit you  well.  Are closed at the toe. Do not wear sandals.  If you use a stepladder:  Make sure that it is fully opened. Do not climb a closed stepladder.  Make sure that both sides of the stepladder are locked into place.  Ask someone to hold it for you, if possible.  Clearly mark and make sure that you can see:  Any grab bars or handrails.  First and last steps.  Where the edge of each step is.  Use tools that help you move around (mobility aids) if they are needed. These include:  Canes.  Walkers.  Scooters.  Crutches.  Turn on the lights when you go into a dark area. Replace any light bulbs as soon as they burn out.  Set up your furniture so you have a clear path. Avoid moving your furniture around.  If any of your floors are uneven, fix them.  If there are any pets around you, be aware of where they are.  Review your medicines with your doctor. Some medicines can make you feel dizzy. This can increase your chance of falling. Ask your doctor what other things that you can do to help prevent falls. This information is not intended to replace advice given to you by your health care provider. Make sure you discuss any questions you have with your health care provider. Document Released: 08/12/2009 Document Revised: 03/23/2016 Document Reviewed: 11/20/2014 Elsevier Interactive Patient Education  2017 [employer].

## 2020-05-25 ENCOUNTER — Other Ambulatory Visit: Payer: Self-pay

## 2020-05-26 ENCOUNTER — Encounter: Payer: Self-pay | Admitting: Family Medicine

## 2020-05-26 ENCOUNTER — Ambulatory Visit (INDEPENDENT_AMBULATORY_CARE_PROVIDER_SITE_OTHER): Payer: Medicare HMO | Admitting: Family Medicine

## 2020-05-26 ENCOUNTER — Ambulatory Visit (INDEPENDENT_AMBULATORY_CARE_PROVIDER_SITE_OTHER): Payer: Medicare HMO

## 2020-05-26 VITALS — BP 130/78 | HR 70 | Temp 98.0°F | Ht 60.5 in | Wt 159.8 lb

## 2020-05-26 DIAGNOSIS — M1712 Unilateral primary osteoarthritis, left knee: Secondary | ICD-10-CM | POA: Diagnosis not present

## 2020-05-26 DIAGNOSIS — M25461 Effusion, right knee: Secondary | ICD-10-CM

## 2020-05-26 NOTE — Progress Notes (Signed)
Kayla Alvarez is a 86 y.o. female  Chief Complaint  Patient presents with  . Follow-up    3 month. Rt knee swelling    HPI: Kayla Alvarez is a 86 y.o. female who complains of persistent Rt knee swelling since 01/2020. I saw her for this issue in 01/2020 and exam was normal other than a very minimal amount of swelling. Ice, NSAIDs PRN were recommended. Today pt states she is still having swelling. No pain. She states at times when she fully flexes her knee that it feels tight. No issues with ROM, ambulation. Knee is not locking or clicking.   Past Medical History:  Diagnosis Date  . ALLERGIC RHINITIS 06/05/2007  . BREAST LUMP 05/19/2010  . CONTACT DERMATITIS&OTH ECZEMA DUE OTH [place] AGENT 02/02/2010  . Edema 07/19/2009  . Glaucoma   . HERPES ZOSTER 11/28/2009  . HYPERLIPIDEMIA 06/05/2007  . PULMONARY NODULE, LEFT UPPER LOBE 07/26/2009  . RENAL INSUFFICIENCY 06/05/2007  . UTI 09/03/2008  . VAGINITIS, ATROPHIC 06/26/2007  . Venous insufficiency     Past Surgical History:  Procedure Laterality Date  . CESAREAN SECTION    . EYE SURGERY    . HEMORRHOID SURGERY    . TOOTH EXTRACTION      Social History   Socioeconomic History  . Marital status: Widowed    Spouse name: Not on file  . Number of children: Not on file  . Years of education: Not on file  . Highest education level: Not on file  Occupational History  . Not on file  Tobacco Use  . Smoking status: Former Research scientist (life sciences)  . Smokeless tobacco: Never Used  Substance and Sexual Activity  . Alcohol use: No  . Drug use: No  . Sexual activity: Never    Comment: 20+ years  Other Topics Concern  . Not on file  Social History Narrative   Desires CPR and life support if not futile   Social Determinants of Health   Financial Resource Strain: Low Risk   . Difficulty of Paying Living Expenses: Not hard at all  Food Insecurity: No Food Insecurity  . Worried About Charity fundraiser in the Last Year: Never true  . Ran Out of Food in the  Last Year: Never true  Transportation Needs: No Transportation Needs  . Lack of Transportation (Medical): No  . Lack of Transportation (Non-Medical): No  Physical Activity: Inactive  . Days of Exercise per Week: 0 days  . Minutes of Exercise per Session: 0 min  Stress: No Stress Concern Present  . Feeling of Stress : Only a little  Social Connections: Moderately Integrated  . Frequency of Communication with Friends and Family: More than three times a week  . Frequency of Social Gatherings with Friends and Family: Twice a week  . Attends Religious Services: More than 4 times per year  . Active Member of Clubs or Organizations: Yes  . Attends Archivist Meetings: More than 4 times per year  . Marital Status: Widowed  Intimate Partner Violence: Not At Risk  . Fear of Current or Ex-Partner: No  . Emotionally Abused: No  . Physically Abused: No  . Sexually Abused: No    Family History  Problem Relation Age of Onset  . Hypertension Mother   . Stroke Mother   . Heart disease Father   . Hypertension Father   . Breast cancer Sister        unsure of age  . Hypertension Sister  Immunization History  Administered Date(s) Administered  . Influenza Whole 10/30/2004  . Pneumococcal Polysaccharide-23 10/30/2001, 06/26/2007  . Td 10/30/1998, 07/19/2009    Outpatient Encounter Medications as of 05/26/2020  Medication Sig  . albuterol (PROVENTIL HFA;VENTOLIN HFA) 108 (90 Base) MCG/ACT inhaler Inhale 2 puffs into the lungs every 6 (six) hours as needed.  . furosemide (LASIX) 20 MG tablet Take 1 tablet (20 mg total) by mouth daily.  . mirabegron ER (MYRBETRIQ) 25 MG TB24 tablet Take 1 tablet (25 mg total) by mouth daily.  . simvastatin (ZOCOR) 20 MG tablet TAKE 1 TABLET BY MOUTH AT BEDTIME (Patient taking differently: Take 20 mg by mouth at bedtime. )   No facility-administered encounter medications on file as of 05/26/2020.     ROS: Pertinent positives and negatives noted  in HPI. Remainder of ROS non-contributory    No Known Allergies  BP (!) 130/78 (BP Location: Left Arm, Patient Position: Sitting, Cuff Size: Normal)   Pulse 70   Temp 98 F (36.7 C) (Temporal)   Ht 5' 0.5" (1.537 m)   Wt 159 lb 12.8 oz (72.5 kg)   SpO2 97%   BMI 30.70 kg/m   Physical Exam Constitutional:      General: She is not in acute distress.    Appearance: Normal appearance. She is not toxic-appearing.  Musculoskeletal:        General: Normal range of motion.     Right knee: Swelling present. No deformity, effusion, ecchymosis, bony tenderness or crepitus. Normal range of motion. Tenderness present over the medial joint line.  Neurological:     Mental Status: She is alert.     A/P:   1. Swelling of right knee joint - symptoms since 01/2020, no injury; no pain or other symptoms - Ambulatory referral to Sports Medicine - DG Knee 3 Views Left; Future   This visit occurred during the SARS-CoV-2 public health emergency.  Safety protocols were in place, including screening questions prior to the visit, additional usage of staff PPE, and extensive cleaning of exam room while observing appropriate contact time as indicated for disinfecting solutions.

## 2020-05-27 ENCOUNTER — Telehealth: Payer: Self-pay | Admitting: Family Medicine

## 2020-05-27 NOTE — Telephone Encounter (Signed)
Dr Bryan Lemma referred pt to Dr. Raeford Razor, called her to set appt pt states waiting for Xray results review w/PCP  Before making appt) --Pt to call office back afterward.  --glh

## 2020-06-03 ENCOUNTER — Encounter: Payer: Self-pay | Admitting: Family Medicine

## 2020-06-03 ENCOUNTER — Ambulatory Visit: Payer: Medicare HMO | Admitting: Family Medicine

## 2020-06-03 ENCOUNTER — Other Ambulatory Visit: Payer: Self-pay

## 2020-06-03 ENCOUNTER — Ambulatory Visit: Payer: Self-pay

## 2020-06-03 VITALS — BP 148/73 | HR 71 | Ht 61.0 in | Wt 159.0 lb

## 2020-06-03 DIAGNOSIS — M25461 Effusion, right knee: Secondary | ICD-10-CM | POA: Diagnosis not present

## 2020-06-03 DIAGNOSIS — M25561 Pain in right knee: Secondary | ICD-10-CM

## 2020-06-03 NOTE — Assessment & Plan Note (Signed)
Has a moderate effusion as well as a Baker's cyst.  Likely secondary to degenerative changes of the meniscus. -Counseled on home exercise therapy and supportive care. -Counseled on compression. -Could consider aspiration and physical therapy.

## 2020-06-03 NOTE — Progress Notes (Signed)
Kayla Alvarez - 86 y.o. female MRN [id number]  Date of birth: 1934/05/13  SUBJECTIVE:  Including CC & ROS.  Chief Complaint  Patient presents with  . Knee Pain    right x 4 months    Kayla Alvarez is a 86 y.o. female that is presenting with right knee swelling.  This is been ongoing for a few months.  She feels these changes of the medial aspect of the knee.  No inciting event or trauma.  Seems to be worse after working on her feet for a period of time..  Independent review of the left knee x-ray from 7/28 shows mild degenerative changes.   Review of Systems See HPI   HISTORY: Past Medical, Surgical, Social, and Family History Reviewed & Updated per EMR.   Pertinent Historical Findings include:  Past Medical History:  Diagnosis Date  . ALLERGIC RHINITIS 06/05/2007  . BREAST LUMP 05/19/2010  . CONTACT DERMATITIS&OTH ECZEMA DUE OTH [place] AGENT 02/02/2010  . Edema 07/19/2009  . Glaucoma   . HERPES ZOSTER 11/28/2009  . HYPERLIPIDEMIA 06/05/2007  . PULMONARY NODULE, LEFT UPPER LOBE 07/26/2009  . RENAL INSUFFICIENCY 06/05/2007  . UTI 09/03/2008  . VAGINITIS, ATROPHIC 06/26/2007  . Venous insufficiency     Past Surgical History:  Procedure Laterality Date  . CESAREAN SECTION    . EYE SURGERY    . HEMORRHOID SURGERY    . TOOTH EXTRACTION      Family History  Problem Relation Age of Onset  . Hypertension Mother   . Stroke Mother   . Heart disease Father   . Hypertension Father   . Breast cancer Sister        unsure of age  . Hypertension Sister     Social History   Socioeconomic History  . Marital status: Widowed    Spouse name: Not on file  . Number of children: Not on file  . Years of education: Not on file  . Highest education level: Not on file  Occupational History  . Not on file  Tobacco Use  . Smoking status: Former Research scientist (life sciences)  . Smokeless tobacco: Never Used  Substance and Sexual Activity  . Alcohol use: No  . Drug use: No  . Sexual activity: Never    Comment: 20+  years  Other Topics Concern  . Not on file  Social History Narrative   Desires CPR and life support if not futile   Social Determinants of Health   Financial Resource Strain: Low Risk   . Difficulty of Paying Living Expenses: Not hard at all  Food Insecurity: No Food Insecurity  . Worried About Charity fundraiser in the Last Year: Never true  . Ran Out of Food in the Last Year: Never true  Transportation Needs: No Transportation Needs  . Lack of Transportation (Medical): No  . Lack of Transportation (Non-Medical): No  Physical Activity: Inactive  . Days of Exercise per Week: 0 days  . Minutes of Exercise per Session: 0 min  Stress: No Stress Concern Present  . Feeling of Stress : Only a little  Social Connections: Moderately Integrated  . Frequency of Communication with Friends and Family: More than three times a week  . Frequency of Social Gatherings with Friends and Family: Twice a week  . Attends Religious Services: More than 4 times per year  . Active Member of Clubs or Organizations: Yes  . Attends Archivist Meetings: More than 4 times per year  . Marital  Status: Widowed  Intimate Partner Violence: Not At Risk  . Fear of Current or Ex-Partner: No  . Emotionally Abused: No  . Physically Abused: No  . Sexually Abused: No     PHYSICAL EXAM:  VS: BP (!) 148/73   Pulse 71   Ht 5\' 1"  (1.549 m)   Wt 159 lb (72.1 kg)   BMI 30.04 kg/m  Physical Exam Gen: NAD, alert, cooperative with exam, well-appearing MSK:  Right knee: No obvious effusion. No tenderness to palpation. Normal range of motion. No instability with valgus or varus stress testing. Neurovascular intact  Limited ultrasound: Right knee:  Moderate effusion within the suprapatellar pouch. Normal medial joint space but degenerative changes of the medial meniscus. Normal-appearing lateral joint space with degenerative changes of the lateral meniscus. Large Baker's cyst.  Summary: Degenerative  changes appreciated the meniscus as well as an effusion and Baker's cyst.  Ultrasound and interpretation by Clearance Coots, MD    ASSESSMENT & PLAN:   Knee effusion, right Has a moderate effusion as well as a Baker's cyst.  Likely secondary to degenerative changes of the meniscus. -Counseled on home exercise therapy and supportive care. -Counseled on compression. -Could consider aspiration and physical therapy.

## 2020-06-03 NOTE — Patient Instructions (Signed)
NIce to meet you Please try wrapping your knee  Please try tylenol for pain  Please try ice as needed   Please send me a message in MyChart with any questions or updates.  Please see me back in 4 weeks.   --Dr. Raeford Razor

## 2020-06-10 ENCOUNTER — Encounter: Payer: Self-pay | Admitting: Family Medicine

## 2020-06-10 DIAGNOSIS — H35373 Puckering of macula, bilateral: Secondary | ICD-10-CM | POA: Diagnosis not present

## 2020-06-10 DIAGNOSIS — H40053 Ocular hypertension, bilateral: Secondary | ICD-10-CM | POA: Diagnosis not present

## 2020-06-10 DIAGNOSIS — H353132 Nonexudative age-related macular degeneration, bilateral, intermediate dry stage: Secondary | ICD-10-CM | POA: Diagnosis not present

## 2020-06-10 DIAGNOSIS — H402231 Chronic angle-closure glaucoma, bilateral, mild stage: Secondary | ICD-10-CM | POA: Diagnosis not present

## 2020-06-21 ENCOUNTER — Encounter: Payer: Self-pay | Admitting: Family Medicine

## 2020-07-14 ENCOUNTER — Ambulatory Visit (INDEPENDENT_AMBULATORY_CARE_PROVIDER_SITE_OTHER): Payer: Medicare HMO | Admitting: Family Medicine

## 2020-07-14 ENCOUNTER — Encounter: Payer: Self-pay | Admitting: Family Medicine

## 2020-07-14 ENCOUNTER — Other Ambulatory Visit: Payer: Self-pay

## 2020-07-14 DIAGNOSIS — M25461 Effusion, right knee: Secondary | ICD-10-CM | POA: Diagnosis not present

## 2020-07-14 NOTE — Assessment & Plan Note (Signed)
Has been getting around with her daily activities with no pain.  - counseled on home exercise therapy and supportive care - could consider injection or physical therapy.

## 2020-07-14 NOTE — Progress Notes (Signed)
Kayla Alvarez - 86 y.o. female MRN [id number]  Date of birth: 12/06/1933  SUBJECTIVE:  Including CC & ROS.  No chief complaint on file.   Kayla Alvarez is a 86 y.o. female that is following up for her right knee pain. She has been doing well. Has been on a few different trips with no problems. Unsure if ACE wrap has been helping.    Review of Systems See HPI   HISTORY: Past Medical, Surgical, Social, and Family History Reviewed & Updated per EMR.   Pertinent Historical Findings include:  Past Medical History:  Diagnosis Date  . ALLERGIC RHINITIS 06/05/2007  . BREAST LUMP 05/19/2010  . CONTACT DERMATITIS&OTH ECZEMA DUE OTH [place] AGENT 02/02/2010  . Edema 07/19/2009  . Glaucoma   . HERPES ZOSTER 11/28/2009  . HYPERLIPIDEMIA 06/05/2007  . PULMONARY NODULE, LEFT UPPER LOBE 07/26/2009  . RENAL INSUFFICIENCY 06/05/2007  . UTI 09/03/2008  . VAGINITIS, ATROPHIC 06/26/2007  . Venous insufficiency     Past Surgical History:  Procedure Laterality Date  . CESAREAN SECTION    . EYE SURGERY    . HEMORRHOID SURGERY    . TOOTH EXTRACTION      Family History  Problem Relation Age of Onset  . Hypertension Mother   . Stroke Mother   . Heart disease Father   . Hypertension Father   . Breast cancer Sister        unsure of age  . Hypertension Sister     Social History   Socioeconomic History  . Marital status: Widowed    Spouse name: Not on file  . Number of children: Not on file  . Years of education: Not on file  . Highest education level: Not on file  Occupational History  . Not on file  Tobacco Use  . Smoking status: Former Research scientist (life sciences)  . Smokeless tobacco: Never Used  Substance and Sexual Activity  . Alcohol use: No  . Drug use: No  . Sexual activity: Never    Comment: 20+ years  Other Topics Concern  . Not on file  Social History Narrative   Desires CPR and life support if not futile   Social Determinants of Health   Financial Resource Strain: Low Risk   . Difficulty of Paying  Living Expenses: Not hard at all  Food Insecurity: No Food Insecurity  . Worried About Charity fundraiser in the Last Year: Never true  . Ran Out of Food in the Last Year: Never true  Transportation Needs: No Transportation Needs  . Lack of Transportation (Medical): No  . Lack of Transportation (Non-Medical): No  Physical Activity: Inactive  . Days of Exercise per Week: 0 days  . Minutes of Exercise per Session: 0 min  Stress: No Stress Concern Present  . Feeling of Stress : Only a little  Social Connections: Moderately Integrated  . Frequency of Communication with Friends and Family: More than three times a week  . Frequency of Social Gatherings with Friends and Family: Twice a week  . Attends Religious Services: More than 4 times per year  . Active Member of Clubs or Organizations: Yes  . Attends Archivist Meetings: More than 4 times per year  . Marital Status: Widowed  Intimate Partner Violence: Not At Risk  . Fear of Current or Ex-Partner: No  . Emotionally Abused: No  . Physically Abused: No  . Sexually Abused: No     PHYSICAL EXAM:  VS: There were no vitals  taken for this visit. Physical Exam Gen: NAD, alert, cooperative with exam, well-appearing   ASSESSMENT & PLAN:   Knee effusion, right Has been getting around with her daily activities with no pain.  - counseled on home exercise therapy and supportive care - could consider injection or physical therapy.

## 2020-07-14 NOTE — Patient Instructions (Signed)
Good to see you Please try ice as needed  Please continue the exercises   Please send me a message in MyChart with any questions or updates.  Please see [place] back as needed.   --Dr. Raeford Razor

## 2020-08-18 IMAGING — MG DIGITAL SCREENING BILATERAL MAMMOGRAM WITH TOMO AND CAD
6 of 10 series · 6 of 30 positions shown · non-contrast
Comparison: Previous exam(s).

CLINICAL DATA: Screening.

EXAM:
DIGITAL SCREENING BILATERAL MAMMOGRAM WITH TOMO AND CAD

[L CC synth-2D]
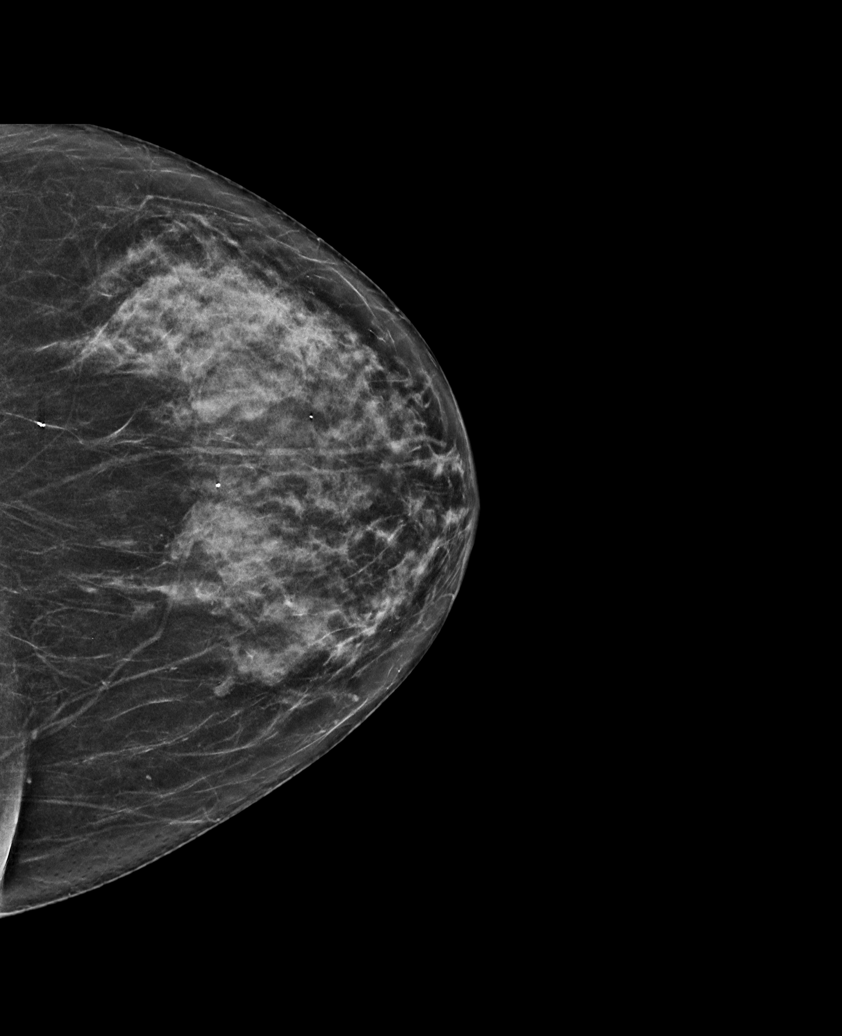

[R MLO synth-2D (1 of 2)]
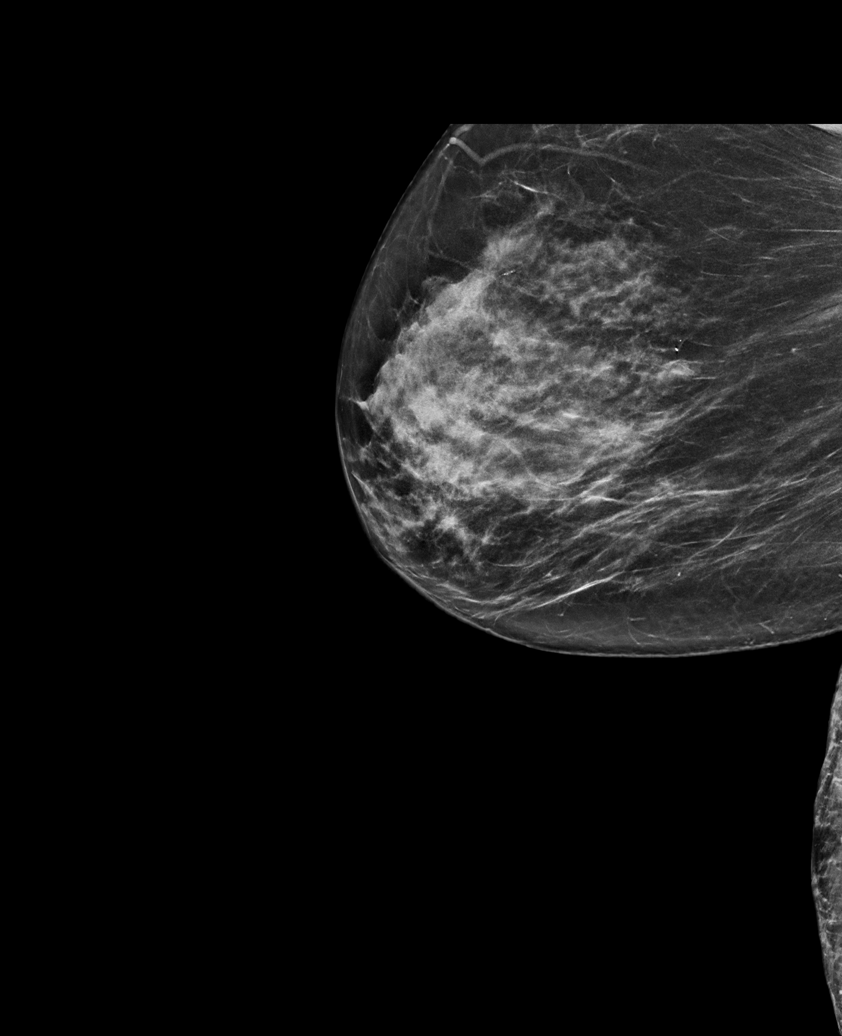

[R CC synth-2D]
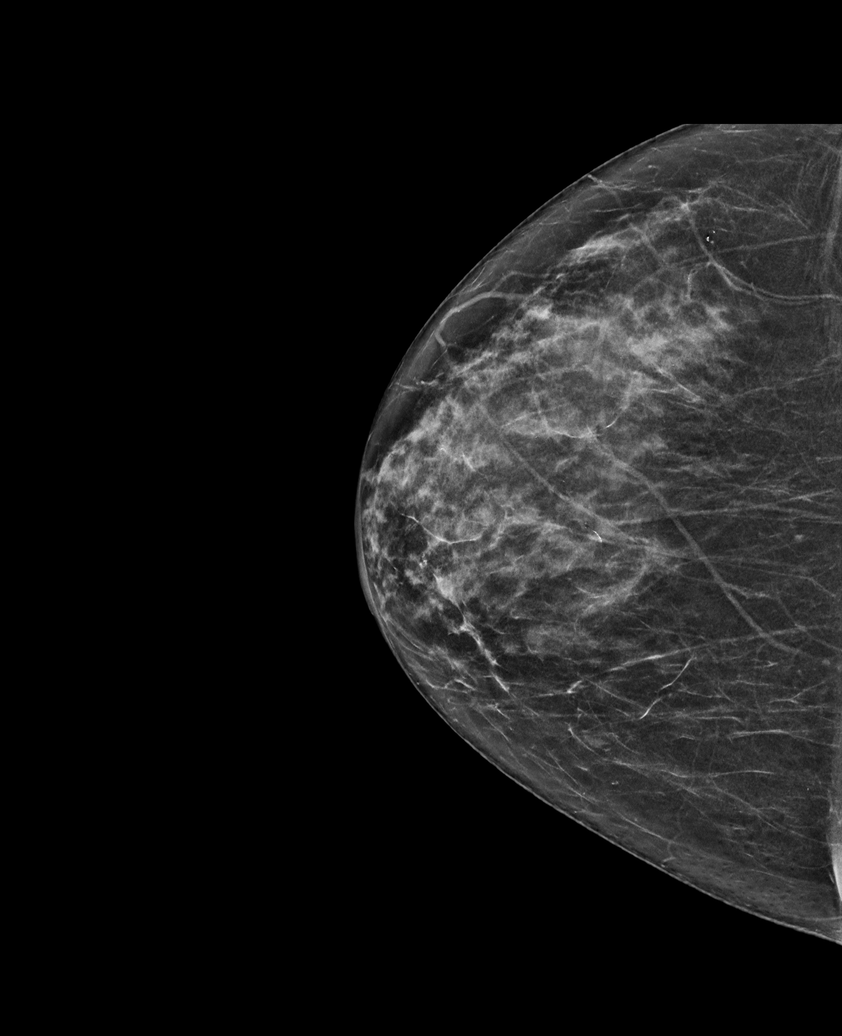

[L MLO synth-2D]
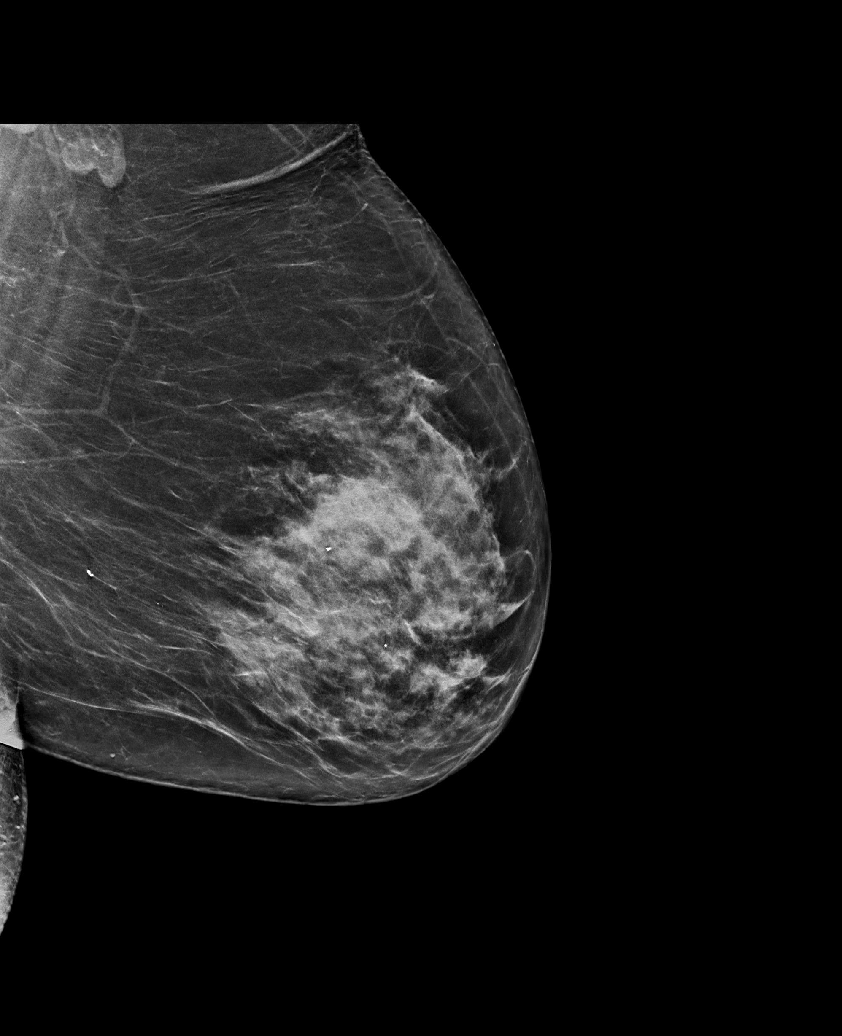

[R MLO synth-2D (2 of 2)]
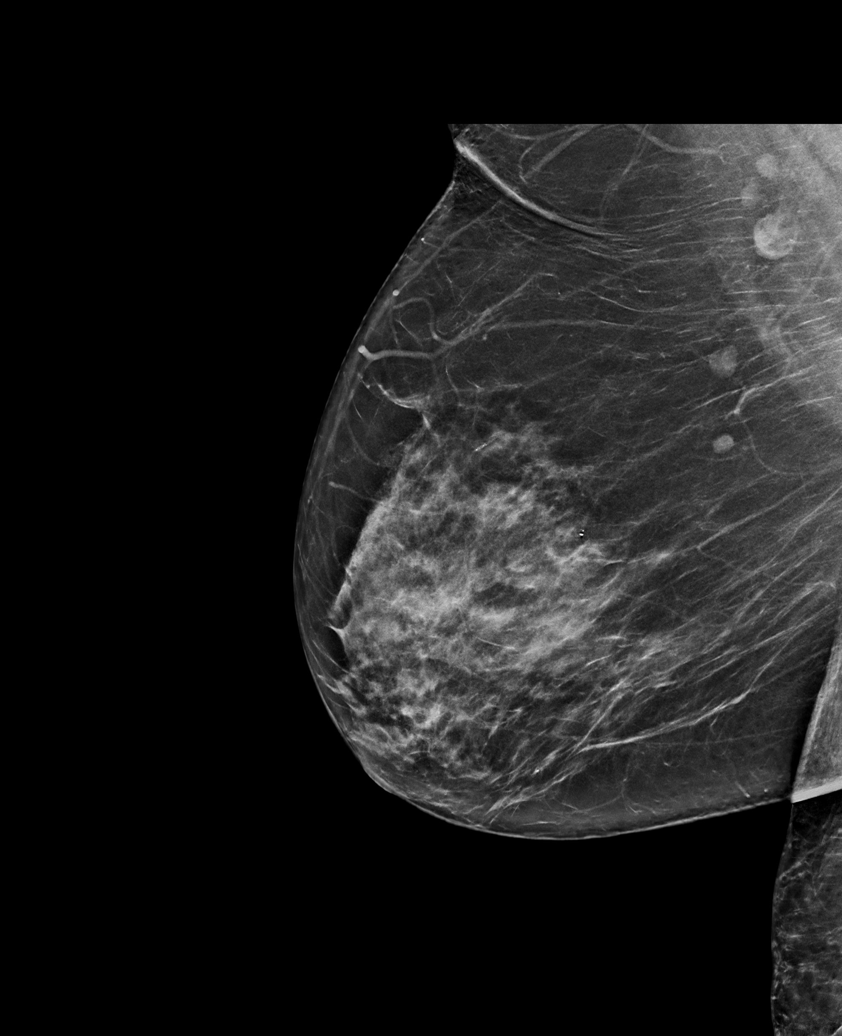

[R MLO tomo · tomo slice 39/77.0]
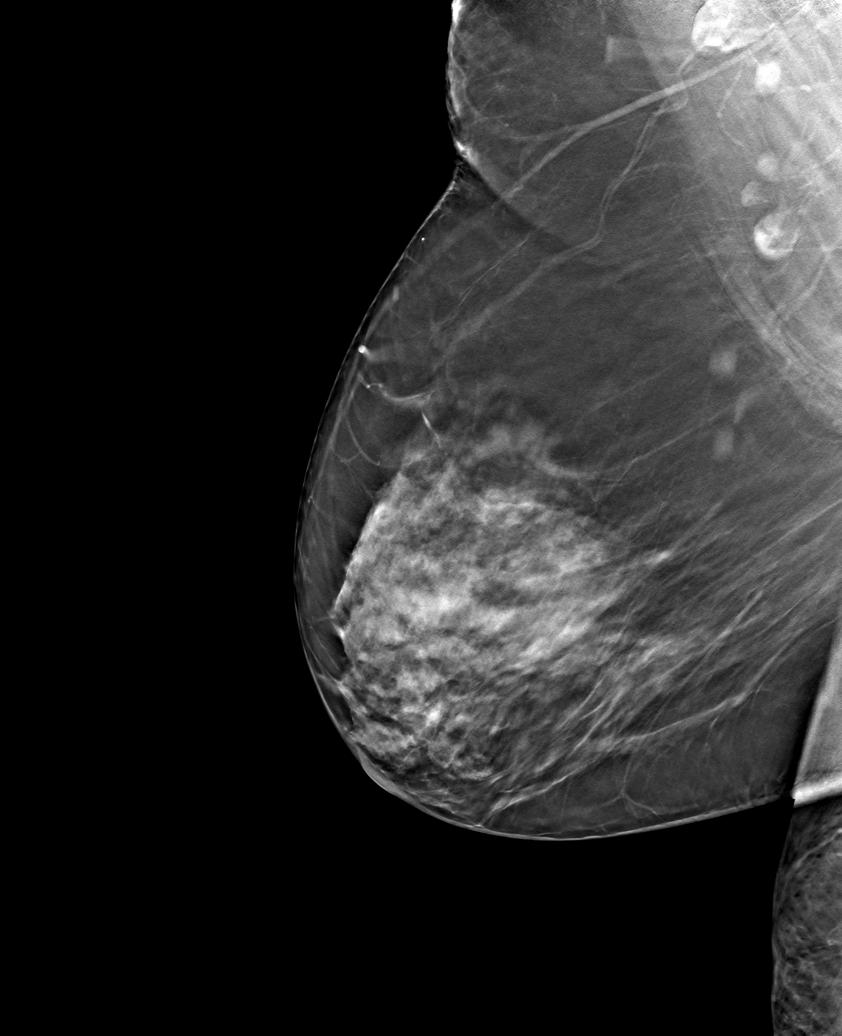

[6 of 30 positions shown; findings below may reference images not displayed]

ACR Breast Density Category c: The breast tissue is heterogeneously
dense, which may obscure small masses.
FINDINGS: There are no findings suspicious for malignancy. Images were
processed with CAD.
IMPRESSION: No mammographic evidence of malignancy. A result letter of this
screening mammogram will be mailed directly to the patient.

RECOMMENDATION:
Screening mammogram in one year. (Code:FT-U-LHB)

BI-RADS CATEGORY  1: Negative.

## 2020-09-30 DIAGNOSIS — H402231 Chronic angle-closure glaucoma, bilateral, mild stage: Secondary | ICD-10-CM | POA: Diagnosis not present

## 2020-09-30 DIAGNOSIS — H40053 Ocular hypertension, bilateral: Secondary | ICD-10-CM | POA: Diagnosis not present

## 2020-11-17 ENCOUNTER — Other Ambulatory Visit: Payer: Self-pay

## 2020-11-18 ENCOUNTER — Encounter: Payer: Self-pay | Admitting: Family Medicine

## 2020-11-18 ENCOUNTER — Ambulatory Visit (INDEPENDENT_AMBULATORY_CARE_PROVIDER_SITE_OTHER): Payer: Medicare HMO | Admitting: Family Medicine

## 2020-11-18 VITALS — BP 146/80 | HR 76 | Temp 97.4°F | Ht 60.0 in | Wt 166.8 lb

## 2020-11-18 DIAGNOSIS — Z5320 Procedure and treatment not carried out because of patient's decision for unspecified reasons: Secondary | ICD-10-CM | POA: Diagnosis not present

## 2020-11-18 DIAGNOSIS — J4520 Mild intermittent asthma, uncomplicated: Secondary | ICD-10-CM | POA: Diagnosis not present

## 2020-11-18 DIAGNOSIS — E785 Hyperlipidemia, unspecified: Secondary | ICD-10-CM

## 2020-11-18 DIAGNOSIS — Z2821 Immunization not carried out because of patient refusal: Secondary | ICD-10-CM

## 2020-11-18 DIAGNOSIS — I10 Essential (primary) hypertension: Secondary | ICD-10-CM | POA: Diagnosis not present

## 2020-11-18 DIAGNOSIS — M859 Disorder of bone density and structure, unspecified: Secondary | ICD-10-CM | POA: Diagnosis not present

## 2020-11-18 DIAGNOSIS — M8580 Other specified disorders of bone density and structure, unspecified site: Secondary | ICD-10-CM | POA: Diagnosis not present

## 2020-11-18 DIAGNOSIS — Z1321 Encounter for screening for nutritional disorder: Secondary | ICD-10-CM

## 2020-11-18 DIAGNOSIS — E119 Type 2 diabetes mellitus without complications: Secondary | ICD-10-CM

## 2020-11-18 NOTE — Addendum Note (Signed)
Addended by: Lynnea Ferrier on: 11/18/2020 03:13 PM   Modules accepted: Orders

## 2020-11-18 NOTE — Patient Instructions (Signed)
Check you blood pressure 3-4x/wk x 2 wks and keep a log of these readings Call office in 2 wks with BP readings Goal under 140 / under 90

## 2020-11-18 NOTE — Progress Notes (Signed)
Kayla Alvarez is a 87 y.o. female  Chief Complaint  Patient presents with  . Annual Exam    CPE/labs.  No concerns.   Declines both flu and covid vaccines.    HPI: Kayla Alvarez is a 87 y.o. female seen today for annual exam and f/u on chronic medical issues including HTN, HDL, osteopenia, mild intermittent asthma, diet-controlled DM.   Last mammo: 12/2019 - normal [facility] Last Dexa: only 1 and it was years - pt declines and feels she is "doing just fine" Last colonoscopy: 2008 - Dr. Sherren Mocha w/ [facility] - 10 year f/u - declines additional [facility]: overdue and has not been in years [facility]: last appt in 09/2020, goes regularly  Med refills needed today? none  Past Medical History:  Diagnosis Date  . ALLERGIC RHINITIS 06/05/2007  . BREAST LUMP 05/19/2010  . CONTACT DERMATITIS&OTH ECZEMA DUE OTH [place] AGENT 02/02/2010  . Edema 07/19/2009  . Glaucoma   . HERPES ZOSTER 11/28/2009  . HYPERLIPIDEMIA 06/05/2007  . PULMONARY NODULE, LEFT UPPER LOBE 07/26/2009  . RENAL INSUFFICIENCY 06/05/2007  . UTI 09/03/2008  . VAGINITIS, ATROPHIC 06/26/2007  . Venous insufficiency     Past Surgical History:  Procedure Laterality Date  . CESAREAN SECTION    . EYE SURGERY    . HEMORRHOID SURGERY    . TOOTH EXTRACTION      Social History   Socioeconomic History  . Marital status: Widowed    Spouse name: Not on file  . Number of children: Not on file  . Years of education: Not on file  . Highest education level: Not on file  Occupational History  . Not on file  Tobacco Use  . Smoking status: Former Research scientist (life sciences)  . Smokeless tobacco: Never Used  Vaping Use  . Vaping Use: Never used  Substance and Sexual Activity  . Alcohol use: No  . Drug use: No  . Sexual activity: Yes    Comment: 20+ years  Other Topics Concern  . Not on file  Social History Narrative   Desires CPR and life support if not futile   Social Determinants of Health   Financial Resource Strain: Low Risk   .  Difficulty of Paying Living Expenses: Not hard at all  Food Insecurity: No Food Insecurity  . Worried About Charity fundraiser in the Last Year: Never true  . Ran Out of Food in the Last Year: Never true  Transportation Needs: No Transportation Needs  . Lack of Transportation (Medical): No  . Lack of Transportation (Non-Medical): No  Physical Activity: Inactive  . Days of Exercise per Week: 0 days  . Minutes of Exercise per Session: 0 min  Stress: No Stress Concern Present  . Feeling of Stress : Only a little  Social Connections: Moderately Integrated  . Frequency of Communication with Friends and Family: More than three times a week  . Frequency of Social Gatherings with Friends and Family: Twice a week  . Attends Religious Services: More than 4 times per year  . Active Member of Clubs or Organizations: Yes  . Attends Archivist Meetings: More than 4 times per year  . Marital Status: Widowed  Intimate Partner Violence: Not At Risk  . Fear of Current or Ex-Partner: No  . Emotionally Abused: No  . Physically Abused: No  . Sexually Abused: No    Family History  Problem Relation Age of Onset  . Hypertension Mother   .  Stroke Mother   . Heart disease Father   . Hypertension Father   . Breast cancer Sister        unsure of age  . Hypertension Sister      Immunization History  Administered Date(s) Administered  . Influenza Whole 10/30/2004  . Pneumococcal Polysaccharide-23 10/30/2001, 06/26/2007  . Td 10/30/1998, 07/19/2009    Outpatient Encounter Medications as of 11/18/2020  Medication Sig  . albuterol (PROVENTIL HFA;VENTOLIN HFA) 108 (90 Base) MCG/ACT inhaler Inhale 2 puffs into the lungs every 6 (six) hours as needed.  Marland Kitchen ELDERBERRY PO Take by mouth.  . furosemide (LASIX) 20 MG tablet Take 1 tablet (20 mg total) by mouth daily.  . simvastatin (ZOCOR) 20 MG tablet TAKE 1 TABLET BY MOUTH AT BEDTIME (Patient taking differently: Take 20 mg by mouth at bedtime.)  .  mirabegron ER (MYRBETRIQ) 25 MG TB24 tablet Take 1 tablet (25 mg total) by mouth daily. (Patient not taking: Reported on 11/18/2020)   No facility-administered encounter medications on file as of 11/18/2020.     ROS: Gen: no fever, chills  Skin: no rash, itching ENT: no ear pain, ear drainage, nasal congestion, rhinorrhea, sinus pressure, sore throat Eyes: no blurry vision, double vision Resp: no cough, wheeze,SOB CV: no CP, palpitations, LE edema,  GI: no heartburn, n/v/d/c, abd pain GU: no dysuria, urgency, frequency, hematuria MSK: no joint pain, myalgias, back pain Neuro: no dizziness, headache, weakness, vertigo Psych: no depression, anxiety, insomnia   No Known Allergies  BP (!) 146/80   Pulse 76   Temp (!) 97.4 F (36.3 C) (Temporal)   Ht 5' (1.524 m)   Wt 166 lb 12.8 oz (75.7 kg)   SpO2 99%   BMI 32.58 kg/m    BP Readings from Last 3 Encounters:  11/18/20 (!) 146/80  06/03/20 (!) 148/73  05/26/20 (!) 130/78   Pulse Readings from Last 3 Encounters:  11/18/20 76  06/03/20 71  05/26/20 70   Wt Readings from Last 3 Encounters:  11/18/20 166 lb 12.8 oz (75.7 kg)  07/14/20 163 lb (73.9 kg)  06/03/20 159 lb (72.1 kg)   Physical Exam Constitutional:      General: She is not in acute distress.    Appearance: She is well-developed and well-nourished.  HENT:     Right Ear: Tympanic membrane and ear canal normal.     Left Ear: Tympanic membrane and ear canal normal.     Nose: Nose normal.     Mouth/Throat:     Mouth: Oropharynx is clear and moist and mucous membranes are normal.  Eyes:     Conjunctiva/sclera: Conjunctivae normal.  Neck:     Thyroid: No thyromegaly.  Cardiovascular:     Rate and Rhythm: Normal rate and regular rhythm.     Pulses: Intact distal pulses.     Heart sounds: Normal heart sounds. No murmur heard.   Pulmonary:     Effort: Pulmonary effort is normal. No respiratory distress.     Breath sounds: Normal breath sounds. No wheezing or  rhonchi.  Abdominal:     General: Bowel sounds are normal. There is no distension.     Palpations: Abdomen is soft. There is no mass.     Tenderness: There is no abdominal tenderness.  Musculoskeletal:        General: No edema.     Cervical back: Neck supple.     Right lower leg: No edema.     Left lower leg: No edema.  Lymphadenopathy:     Cervical: No cervical adenopathy.  Skin:    General: Skin is warm and dry.  Neurological:     Mental Status: She is alert and oriented to person, place, and time.     Motor: No abnormal muscle tone.     Coordination: Coordination normal.  Psychiatric:        Mood and Affect: Mood and affect normal.        Behavior: Behavior normal.      A/P:  1. Primary hypertension - uncontrolled, pt is not on medication - she follows low sodium diet - she does not want to restart medication at this time - agree to having pt check BP at home x 2 wks and call with readings - Comprehensive metabolic panel  2. Mild intermittent chronic asthma without complication - controlled - rare use of albuterol inhaler  3. Diabetes mellitus type 2, diet-controlled ([place]) - not on medication - Hemoglobin A1c - Microalbumin / creatinine urine ratio  4. Hyperlipidemia, unspecified hyperlipidemia type - Lipid panel  5. Encounter for vitamin deficiency screening - VITAMIN D 25 Hydroxy (Vit-D Deficiency, Fractures)  6. Influenza vaccination declined by patient  7. COVID-19 vaccination declined  8. Pneumococcal vaccination declined by patient  9. Osteoporosis screening declined   This visit occurred during the SARS-CoV-2 public health emergency.  Safety protocols were in place, including screening questions prior to the visit, additional usage of staff PPE, and extensive cleaning of exam room while observing appropriate contact time as indicated for disinfecting solutions.

## 2020-11-19 LAB — COMPREHENSIVE METABOLIC PANEL
AG Ratio: 1.7 (calc) (ref 1.0–2.5)
ALT: 24 U/L (ref 6–29)
AST: 23 U/L (ref 10–35)
Albumin: 4.3 g/dL (ref 3.6–5.1)
Alkaline phosphatase (APISO): 62 U/L (ref 37–153)
BUN/Creatinine Ratio: 15 (calc) (ref 6–22)
BUN: 14 mg/dL (ref 7–25)
CO2: 28 mmol/L (ref 20–32)
Calcium: 9.6 mg/dL (ref 8.6–10.4)
Chloride: 102 mmol/L (ref 98–110)
Creat: 0.94 mg/dL — ABNORMAL HIGH (ref 0.60–0.88)
Globulin: 2.5 g/dL (calc) (ref 1.9–3.7)
Glucose, Bld: 106 mg/dL — ABNORMAL HIGH (ref 65–99)
Potassium: 4.2 mmol/L (ref 3.5–5.3)
Sodium: 140 mmol/L (ref 135–146)
Total Bilirubin: 0.6 mg/dL (ref 0.2–1.2)
Total Protein: 6.8 g/dL (ref 6.1–8.1)

## 2020-11-19 LAB — LIPID PANEL
Cholesterol: 195 mg/dL (ref <200)
HDL: 61 mg/dL (ref >=50)
LDL Cholesterol (Calc): 115 mg/dL (calc) — ABNORMAL HIGH
Non-HDL Cholesterol (Calc): 134 mg/dL (calc) — ABNORMAL HIGH (ref <130)
Total CHOL/HDL Ratio: 3.2 (calc) (ref <5.0)
Triglycerides: 90 mg/dL (ref <150)

## 2020-11-19 LAB — HEMOGLOBIN A1C
Hgb A1c MFr Bld: 6.6 % of total Hgb — ABNORMAL HIGH (ref <5.7)
Mean Plasma Glucose: 143 mg/dL
eAG (mmol/L): 7.9 mmol/L

## 2020-11-19 LAB — MICROALBUMIN / CREATININE URINE RATIO
Creatinine, Urine: 191 mg/dL (ref 20–275)
Microalb Creat Ratio: 4 mcg/mg creat (ref <30)
Microalb, Ur: 0.8 mg/dL

## 2020-11-19 LAB — VITAMIN D 25 HYDROXY (VIT D DEFICIENCY, FRACTURES): Vit D, 25-Hydroxy: 26 ng/mL — ABNORMAL LOW (ref 30–100)

## 2020-12-08 ENCOUNTER — Telehealth: Payer: Self-pay | Admitting: Family Medicine

## 2020-12-08 DIAGNOSIS — E785 Hyperlipidemia, unspecified: Secondary | ICD-10-CM

## 2020-12-08 DIAGNOSIS — N259 Disorder resulting from impaired renal tubular function, unspecified: Secondary | ICD-10-CM

## 2020-12-08 DIAGNOSIS — J984 Other disorders of lung: Secondary | ICD-10-CM

## 2020-12-08 MED ORDER — SIMVASTATIN 40 MG PO TABS: 40.0000 mg | ORAL_TABLET | Freq: Every day | ORAL | 3 refills | Status: DC

## 2020-12-08 NOTE — Telephone Encounter (Signed)
Patient looking lab results from 11/18/20 and then reported her BP.  Please review and advise.  Thanks. Dm/cma

## 2020-12-08 NOTE — Telephone Encounter (Signed)
Call back phone #: [phone]  Reason for Call:  1/24 Monday 138/84 1/25 Tuesday 144/79 1/26 Wednesday 140/78 1/27 Thursday 135/79  1/31 Monday 129/79 2/2 Wed 137/81 2/4 Friday 147/78 2/6 Sunday 142/70  Pt also states she has not received call for lab results. Ok to leave msg.

## 2020-12-08 NOTE — Telephone Encounter (Signed)
Liver and kidney function, electrolytes are normal.  A1C is 6.6 (was 6.5 1 year ago). Recheck in 8mo Total cholesterol and TG are normal. LDL (bad type) cholesterol is 114, goal is less than 70. I would like her to increase her zocor from 20mg  to 40mg  daily. I'll send new Rx to pharm. Vit D is a little low at 26. I recommend daily OTC Vit D 4000-5000IU per day.  SBP (top number) are borderline as goal is consistently under 140 for top and under 90 for bottom. I know she does not want to restart BP medication but I would recommend a low dose to get BP readings to goal. If she is agreeable, I will send Rx.

## 2020-12-08 NOTE — Telephone Encounter (Signed)
patient notified VIA phone and declines at this time to start any BP meds.  Dm/cma

## 2020-12-24 ENCOUNTER — Telehealth: Payer: Self-pay | Admitting: Family Medicine

## 2020-12-24 MED ORDER — MIRABEGRON ER 25 MG PO TB24: 25.0000 mg | ORAL_TABLET | Freq: Every day | ORAL | 1 refills | Status: DC

## 2020-12-24 NOTE — Telephone Encounter (Signed)
Refill sent.

## 2020-12-24 NOTE — Telephone Encounter (Signed)
Spoke to patient and she states that seh started taking the Myrbetriq again after her last appt and would like to get a refill on it.  I see that it was D/C.   Please review and advise.   Thanks.  Dm/cma

## 2020-12-24 NOTE — Telephone Encounter (Signed)
Patient notified VIA phone. Dm/cma  

## 2020-12-24 NOTE — Telephone Encounter (Signed)
Pt called and said she was trying to get a refill on myrbetriq because she thought Dr Bryan Lemma wanted her to continue taking it

## 2021-01-12 ENCOUNTER — Other Ambulatory Visit: Payer: Self-pay | Admitting: Family Medicine

## 2021-01-12 DIAGNOSIS — Z1231 Encounter for screening mammogram for malignant neoplasm of breast: Secondary | ICD-10-CM

## 2021-01-27 DIAGNOSIS — D1721 Benign lipomatous neoplasm of skin and subcutaneous tissue of right arm: Secondary | ICD-10-CM | POA: Diagnosis not present

## 2021-01-27 DIAGNOSIS — L814 Other melanin hyperpigmentation: Secondary | ICD-10-CM | POA: Diagnosis not present

## 2021-01-27 DIAGNOSIS — D225 Melanocytic nevi of trunk: Secondary | ICD-10-CM | POA: Diagnosis not present

## 2021-01-27 DIAGNOSIS — L430 Hypertrophic lichen planus: Secondary | ICD-10-CM | POA: Diagnosis not present

## 2021-01-27 DIAGNOSIS — L570 Actinic keratosis: Secondary | ICD-10-CM | POA: Diagnosis not present

## 2021-01-27 DIAGNOSIS — D485 Neoplasm of uncertain behavior of skin: Secondary | ICD-10-CM | POA: Diagnosis not present

## 2021-01-27 DIAGNOSIS — D692 Other nonthrombocytopenic purpura: Secondary | ICD-10-CM | POA: Diagnosis not present

## 2021-01-27 DIAGNOSIS — L821 Other seborrheic keratosis: Secondary | ICD-10-CM | POA: Diagnosis not present

## 2021-01-27 DIAGNOSIS — Z85828 Personal history of other malignant neoplasm of skin: Secondary | ICD-10-CM | POA: Diagnosis not present

## 2021-02-03 DIAGNOSIS — H21231 Degeneration of iris (pigmentary), right eye: Secondary | ICD-10-CM | POA: Diagnosis not present

## 2021-02-03 DIAGNOSIS — H40053 Ocular hypertension, bilateral: Secondary | ICD-10-CM | POA: Diagnosis not present

## 2021-02-03 DIAGNOSIS — H402231 Chronic angle-closure glaucoma, bilateral, mild stage: Secondary | ICD-10-CM | POA: Diagnosis not present

## 2021-02-03 LAB — HM DIABETES EYE EXAM

## 2021-02-17 DIAGNOSIS — H402231 Chronic angle-closure glaucoma, bilateral, mild stage: Secondary | ICD-10-CM | POA: Diagnosis not present

## 2021-02-17 DIAGNOSIS — H21231 Degeneration of iris (pigmentary), right eye: Secondary | ICD-10-CM | POA: Diagnosis not present

## 2021-02-17 DIAGNOSIS — H40053 Ocular hypertension, bilateral: Secondary | ICD-10-CM | POA: Diagnosis not present

## 2021-03-07 ENCOUNTER — Other Ambulatory Visit: Payer: Self-pay

## 2021-03-07 ENCOUNTER — Ambulatory Visit
Admission: RE | Admit: 2021-03-07 | Discharge: 2021-03-07 | Disposition: A | Discharge status: Home / Self Care | Payer: Medicare HMO | Source: Ambulatory Visit | Attending: Family Medicine | Admitting: Family Medicine

## 2021-03-07 DIAGNOSIS — Z1231 Encounter for screening mammogram for malignant neoplasm of breast: Secondary | ICD-10-CM | POA: Diagnosis not present

## 2021-03-14 DIAGNOSIS — H402231 Chronic angle-closure glaucoma, bilateral, mild stage: Secondary | ICD-10-CM | POA: Diagnosis not present

## 2021-03-14 LAB — HM DIABETES EYE EXAM

## 2021-03-15 ENCOUNTER — Other Ambulatory Visit: Payer: Self-pay | Admitting: Family Medicine

## 2021-03-15 ENCOUNTER — Encounter: Payer: Self-pay | Admitting: Family Medicine

## 2021-03-15 DIAGNOSIS — J984 Other disorders of lung: Secondary | ICD-10-CM

## 2021-03-15 DIAGNOSIS — R601 Generalized edema: Secondary | ICD-10-CM

## 2021-03-15 DIAGNOSIS — N259 Disorder resulting from impaired renal tubular function, unspecified: Secondary | ICD-10-CM

## 2021-05-11 ENCOUNTER — Ambulatory Visit: Payer: Medicare HMO

## 2021-05-31 ENCOUNTER — Ambulatory Visit: Payer: Medicare HMO

## 2021-06-09 DIAGNOSIS — H402231 Chronic angle-closure glaucoma, bilateral, mild stage: Secondary | ICD-10-CM | POA: Diagnosis not present

## 2021-06-09 DIAGNOSIS — H35373 Puckering of macula, bilateral: Secondary | ICD-10-CM | POA: Diagnosis not present

## 2021-06-09 DIAGNOSIS — H353132 Nonexudative age-related macular degeneration, bilateral, intermediate dry stage: Secondary | ICD-10-CM | POA: Diagnosis not present

## 2021-06-09 DIAGNOSIS — H21231 Degeneration of iris (pigmentary), right eye: Secondary | ICD-10-CM | POA: Diagnosis not present

## 2021-06-21 ENCOUNTER — Ambulatory Visit: Payer: Medicare HMO

## 2021-06-23 ENCOUNTER — Ambulatory Visit (INDEPENDENT_AMBULATORY_CARE_PROVIDER_SITE_OTHER): Payer: Medicare HMO | Admitting: *Deleted

## 2021-06-23 DIAGNOSIS — Z0000 Encounter for general adult medical examination without abnormal findings: Secondary | ICD-10-CM | POA: Diagnosis not present

## 2021-06-23 NOTE — Progress Notes (Signed)
Subjective:   Kayla Alvarez is a 87 y.o. female who presents for Medicare Annual (Subsequent) preventive examination.  I connected with  [name] on 06/23/21 by a telephone enabled telemedicine application and verified that I am speaking with the correct person using two identifiers.   I discussed the limitations of evaluation and management by telemedicine. The patient expressed understanding and agreed to proceed.   Hearing Screening - Comments:: Has hearing aids Does not wear them all the time Vision Screening - Comments:: Up to date Dr. Herbert Deaner    Review of Systems    Na Cardiac Risk Factors include: advanced age (>16mn, >>27women);hypertension     Objective:    Today's Vitals   There is no height or weight on file to calculate BMI.  Advanced Directives 06/23/2021 05/05/2020 03/07/2020  Does Patient Have a Medical Advance Directive? No No No  Would patient like information on creating a medical advance directive? No - Patient declined No - Patient declined No - Patient declined    Current Medications (verified) Outpatient Encounter Medications as of 06/23/2021  Medication Sig   albuterol (PROVENTIL HFA;VENTOLIN HFA) 108 (90 Base) MCG/ACT inhaler Inhale 2 puffs into the lungs every 6 (six) hours as needed.   ELDERBERRY PO Take by mouth.   furosemide (LASIX) 20 MG tablet Take 1 tablet by mouth once daily   mirabegron ER (MYRBETRIQ) 25 MG TB24 tablet Take 1 tablet (25 mg total) by mouth daily.   simvastatin (ZOCOR) 40 MG tablet Take 1 tablet (40 mg total) by mouth at bedtime.   No facility-administered encounter medications on file as of 06/23/2021.    Allergies (verified) Patient has no known allergies.   History: Past Medical History:  Diagnosis Date   ALLERGIC RHINITIS 06/05/2007   BREAST LUMP 05/19/2010   CONTACT DERMATITIS&OTH ECZEMA DUE OTH SPEC AGENT 02/02/2010   Edema 07/19/2009   Glaucoma    HERPES ZOSTER 11/28/2009   HYPERLIPIDEMIA 06/05/2007   PULMONARY  NODULE, LEFT UPPER LOBE 07/26/2009   RENAL INSUFFICIENCY 06/05/2007   UTI 09/03/2008   VAGINITIS, ATROPHIC 06/26/2007   Venous insufficiency    Past Surgical History:  Procedure Laterality Date   CESAREAN SECTION     EYE SURGERY     HEMORRHOID SURGERY     TOOTH EXTRACTION     Family History  Problem Relation Age of Onset   Hypertension Mother    Stroke Mother    Heart disease Father    Hypertension Father    Breast cancer Sister        unsure of age   Hypertension Sister    Social History   Socioeconomic History   Marital status: Widowed    Spouse name: Not on file   Number of children: Not on file   Years of education: Not on file   Highest education level: Not on file  Occupational History   Not on file  Tobacco Use   Smoking status: Former   Smokeless tobacco: Never  Vaping Use   Vaping Use: Never used  Substance and Sexual Activity   Alcohol use: No   Drug use: No   Sexual activity: Yes    Comment: 20+ years  Other Topics Concern   Not on file  Social History Narrative   Desires CPR and life support if not futile   Social Determinants of Health   Financial Resource Strain: Low Risk    Difficulty of Paying Living Expenses: Not hard at all  Food  Insecurity: No Food Insecurity   Worried About Charity fundraiser in the Last Year: Never true   Ran Out of Food in the Last Year: Never true  Transportation Needs: No Transportation Needs   Lack of Transportation (Medical): No   Lack of Transportation (Non-Medical): No  Physical Activity: Inactive   Days of Exercise per Week: 0 days   Minutes of Exercise per Session: 0 min  Stress: No Stress Concern Present   Feeling of Stress : Only a little  Social Connections: Moderately Isolated   Frequency of Communication with Friends and Family: Twice a week   Frequency of Social Gatherings with Friends and Family: Three times a week   Attends Religious Services: More than 4 times per year   Active Member of Clubs or  Organizations: No   Attends Archivist Meetings: Never   Marital Status: Widowed    Tobacco Counseling Counseling given: Not Answered   Clinical Intake:  Pre-visit preparation completed: Yes  Pain : No/denies pain     Nutritional Risks: None Diabetes: No (patient states she is not diabetic)  How often do you need to have someone help you when you read instructions, pamphlets, or other written materials from your doctor or pharmacy?: 1 - Never  Diabetic? No  patient states she controls her diet does not take medication   Interpreter Needed?: No  Information entered by :: Leroy Kennedy LPN   Activities of Daily Living In your present state of health, do you have any difficulty performing the following activities: 06/23/2021  Hearing? Y  Vision? N  Difficulty concentrating or making decisions? N  Walking or climbing stairs? N  Dressing or bathing? N  Doing errands, shopping? N  Preparing Food and eating ? N  Using the Toilet? N  In the past six months, have you accidently leaked urine? Y  Comment wears pad.   on medication  Do you have problems with loss of bowel control? N  Managing your Medications? N  Managing your Finances? N  Housekeeping or managing your Housekeeping? N  Some recent data might be hidden    Patient Care Team: Sable Feil, MD as Consulting Physician (Gastroenterology) Rolm Bookbinder, MD as Consulting Physician (Dermatology) Monna Fam, MD as Consulting Physician (Ophthalmology) Monna Fam, MD as Consulting Physician (Ophthalmology)  Indicate any recent Medical Services you may have received from other than [facility] providers in the past year (date may be approximate).     Assessment:   This is a routine wellness examination for Kayla Alvarez.  Hearing/Vision screen Hearing Screening - Comments:: Has hearing aids Does not wear them all the time Vision Screening - Comments:: Up to date Dr. Herbert Deaner  Dietary issues and exercise  activities discussed: Current Exercise Habits: Home exercise routine;The patient does not participate in regular exercise at present, Intensity: Not Applicable   Goals Addressed             This Visit's Progress    Patient Stated   Not on track    Would like to lose some weight by watching diet & increasing activity     Patient Stated       Would like to increase physical activity       Depression Screen PHQ 2/9 Scores 06/23/2021 05/05/2020 11/10/2019 11/05/2017 10/04/2016 09/22/2015 09/16/2014  PHQ - 2 Score 0 0 0 0 0 0 0    Fall Risk Fall Risk  06/23/2021 05/05/2020 11/10/2019 11/06/2018 11/05/2017  Falls in the past year? 0  0 0 0 No  Number falls in past yr: 0 0 - - -  Injury with Fall? 0 0 - - -  Follow up Falls evaluation completed;Falls prevention discussed Falls prevention discussed - Falls evaluation completed -    FALL RISK PREVENTION PERTAINING TO THE HOME:  Any stairs in or around the home? Yes  If so, are there any without handrails? No  Home free of loose throw rugs in walkways, pet beds, electrical cords, etc? Yes  Adequate lighting in your home to reduce risk of falls? Yes   ASSISTIVE DEVICES UTILIZED TO PREVENT FALLS:  Life alert? No  Use of a cane, walker or w/c? No  Grab bars in the bathroom? No  Shower chair or bench in shower? No  Elevated toilet seat or a handicapped toilet? No   TIMED UP AND GO:  Was the test performed? No .     Cognitive Function:     6CIT Screen 05/05/2020  What Year? 0 points  What month? 0 points  What time? 0 points  Count back from 20 0 points  Months in reverse 0 points  Repeat phrase 2 points  Total Score 2    Immunizations Immunization History  Administered Date(s) Administered   Influenza Whole 10/30/2004   Pneumococcal Polysaccharide-23 10/30/2001, 06/26/2007   Td 10/30/1998, 07/19/2009    TDAP status: Due, Education has been provided regarding the importance of this vaccine. Advised may receive this vaccine at  local pharmacy or Health Dept. Aware to provide a copy of the vaccination record if obtained from local pharmacy or Health Dept. Verbalized acceptance and understanding.  Flu Vaccine status: Declined, Education has been provided regarding the importance of this vaccine but patient still declined. Advised may receive this vaccine at local pharmacy or Health Dept. Aware to provide a copy of the vaccination record if obtained from local pharmacy or Health Dept. Verbalized acceptance and understanding.  Pneumococcal vaccine status: Up to date  Covid-19 vaccine status: Declined, Education has been provided regarding the importance of this vaccine but patient still declined. Advised may receive this vaccine at local pharmacy or Health Dept.or vaccine clinic. Aware to provide a copy of the vaccination record if obtained from local pharmacy or Health Dept. Verbalized acceptance and understanding.  Qualifies for Shingles Vaccine? Yes   Zostavax completed Yes   Shingrix Completed?: Yes  Screening Tests Health Maintenance  Topic Date Due   COVID-19 Vaccine (1) Never done   Zoster Vaccines- Shingrix (1 of 2) Never done   PNA vac Low Risk Adult (2 of 2 - PCV13) 06/25/2008   TETANUS/TDAP  07/20/2019   FOOT EXAM  11/09/2020   HEMOGLOBIN A1C  05/18/2021   URINE MICROALBUMIN  11/18/2021   OPHTHALMOLOGY EXAM  03/14/2022   DEXA SCAN  Completed   HPV VACCINES  Aged Out   INFLUENZA VACCINE  Discontinued    Health Maintenance  Health Maintenance Due  Topic Date Due   COVID-19 Vaccine (1) Never done   Zoster Vaccines- Shingrix (1 of 2) Never done   PNA vac Low Risk Adult (2 of 2 - PCV13) 06/25/2008   TETANUS/TDAP  07/20/2019   FOOT EXAM  11/09/2020   HEMOGLOBIN A1C  05/18/2021    Colorectal cancer screening: No longer required.   Mammogram status: No longer required due to age.  Bone Density   declined  Lung Cancer Screening: (Low Dose CT Chest recommended if Age 27-80 years, 30 pack-year  currently smoking OR have quit w/in 15years.)  does not qualify.   Lung Cancer Screening Referral:   Additional Screening:  Hepatitis C Screening: does not qualify;   Vision Screening: Recommended annual ophthalmology exams for early detection of glaucoma and other disorders of the eye. Is the patient up to date with their annual eye exam?  Yes  Who is the provider or what is the name of the office in which the patient attends annual eye exams? Dr. Herbert Deaner If pt is not established with a provider, would they like to be referred to a provider to establish care? No .   Dental Screening: Recommended annual dental exams for proper oral hygiene  Community Resource Referral / Chronic Care Management: CRR required this visit?  No   CCM required this visit?  No      Plan:     I have personally reviewed and noted the following in the patient's chart:   Medical and social history Use of alcohol, tobacco or illicit drugs  Current medications and supplements including opioid prescriptions.  Functional ability and status Nutritional status Physical activity Advanced directives List of other physicians Hospitalizations, surgeries, and ER visits in previous 12 months Vitals Screenings to include cognitive, depression, and falls Referrals and appointments  In addition, I have reviewed and discussed with patient certain preventive protocols, quality metrics, and best practice recommendations. A written personalized care plan for preventive services as well as general preventive health recommendations were provided to patient.     Leroy Kennedy, LPN   [phone]   Nurse Notes: na

## 2021-06-23 NOTE — Patient Instructions (Signed)
[name] , Thank you for taking time to come for your Medicare Wellness Visit. I appreciate your ongoing commitment to your health goals. Please review the following plan we discussed and let me know if I can assist you in the future.   Screening recommendations/referrals: Colonoscopy: no longer required Mammogram: no longer required Bone Density: Education provided Recommended yearly ophthalmology/optometry visit for glaucoma screening and checkup Recommended yearly dental visit for hygiene and checkup  Vaccinations: Influenza vaccine: Education provided Pneumococcal vaccine: up to date Tdap vaccine: Education provided Shingles vaccine: Education provided    Advanced directives: Education provided  Conditions/risks identified: na     Preventive Care 85 Years and Older, Female Preventive care refers to lifestyle choices and visits with your health care provider that can promote health and wellness. What does preventive care include? A yearly physical exam. This is also called an annual well check. Dental exams once or twice a year. Routine eye exams. Ask your health care provider how often you should have your eyes checked. Personal lifestyle choices, including: Daily care of your teeth and gums. Regular physical activity. Eating a healthy diet. Avoiding tobacco and drug use. Limiting alcohol use. Practicing safe sex. Taking low-dose aspirin every day. Taking vitamin and mineral supplements as recommended by your health care provider. What happens during an annual well check? The services and screenings done by your health care provider during your annual well check will depend on your age, overall health, lifestyle risk factors, and family history of disease. Counseling  Your health care provider may ask you questions about your: Alcohol use. Tobacco use. Drug use. Emotional well-being. Home and relationship well-being. Sexual activity. Eating habits. History of  falls. Memory and ability to understand (cognition). Work and work Statistician. Reproductive health. Screening  You may have the following tests or measurements: Height, weight, and BMI. Blood pressure. Lipid and cholesterol levels. These may be checked every 5 years, or more frequently if you are over 12 years old. Skin check. Lung cancer screening. You may have this screening every year starting at age 67 if you have a 30-pack-year history of smoking and currently smoke or have quit within the past 15 years. Fecal occult blood test (FOBT) of the stool. You may have this test every year starting at age 38. Flexible sigmoidoscopy or colonoscopy. You may have a sigmoidoscopy every 5 years or a colonoscopy every 10 years starting at age 44. Hepatitis C blood test. Hepatitis B blood test. Sexually transmitted disease (STD) testing. Diabetes screening. This is done by checking your blood sugar (glucose) after you have not eaten for a while (fasting). You may have this done every 1-3 years. Bone density scan. This is done to screen for osteoporosis. You may have this done starting at age 22. Mammogram. This may be done every 1-2 years. Talk to your health care provider about how often you should have regular mammograms. Talk with your health care provider about your test results, treatment options, and if necessary, the need for more tests. Vaccines  Your health care provider may recommend certain vaccines, such as: Influenza vaccine. This is recommended every year. Tetanus, diphtheria, and acellular pertussis (Tdap, Td) vaccine. You may need a Td booster every 10 years. Zoster vaccine. You may need this after age 45. Pneumococcal 13-valent conjugate (PCV13) vaccine. One dose is recommended after age 23. Pneumococcal polysaccharide (PPSV23) vaccine. One dose is recommended after age 68. Talk to your health care provider about which screenings and vaccines you need and  how often you need  them. This information is not intended to replace advice given to you by your health care provider. Make sure you discuss any questions you have with your health care provider. Document Released: 11/12/2015 Document Revised: 07/05/2016 Document Reviewed: 08/17/2015 Elsevier Interactive Patient Education  2017 [place] Prevention in the Home Falls can cause injuries. They can happen to people of all ages. There are many things you can do to make your home safe and to help prevent falls. What can I do on the outside of my home? Regularly fix the edges of walkways and driveways and fix any cracks. Remove anything that might make you trip as you walk through a door, such as a raised step or threshold. Trim any bushes or trees on the path to your home. Use bright outdoor lighting. Clear any walking paths of anything that might make someone trip, such as rocks or tools. Regularly check to see if handrails are loose or broken. Make sure that both sides of any steps have handrails. Any raised decks and porches should have guardrails on the edges. Have any leaves, snow, or ice cleared regularly. Use sand or salt on walking paths during winter. Clean up any spills in your garage right away. This includes oil or grease spills. What can I do in the bathroom? Use night lights. Install grab bars by the toilet and in the tub and shower. Do not use towel bars as grab bars. Use non-skid mats or decals in the tub or shower. If you need to sit down in the shower, use a plastic, non-slip stool. Keep the floor dry. Clean up any water that spills on the floor as soon as it happens. Remove soap buildup in the tub or shower regularly. Attach bath mats securely with double-sided non-slip rug tape. Do not have throw rugs and other things on the floor that can make you trip. What can I do in the bedroom? Use night lights. Make sure that you have a light by your bed that is easy to reach. Do not use  any sheets or blankets that are too big for your bed. They should not hang down onto the floor. Have a firm chair that has side arms. You can use this for support while you get dressed. Do not have throw rugs and other things on the floor that can make you trip. What can I do in the kitchen? Clean up any spills right away. Avoid walking on wet floors. Keep items that you use a lot in easy-to-reach places. If you need to reach something above you, use a strong step stool that has a grab bar. Keep electrical cords out of the way. Do not use floor polish or wax that makes floors slippery. If you must use wax, use non-skid floor wax. Do not have throw rugs and other things on the floor that can make you trip. What can I do with my stairs? Do not leave any items on the stairs. Make sure that there are handrails on both sides of the stairs and use them. Fix handrails that are broken or loose. Make sure that handrails are as long as the stairways. Check any carpeting to make sure that it is firmly attached to the stairs. Fix any carpet that is loose or worn. Avoid having throw rugs at the top or bottom of the stairs. If you do have throw rugs, attach them to the floor with carpet tape. Make sure that you have  a light switch at the top of the stairs and the bottom of the stairs. If you do not have them, ask someone to add them for you. What else can I do to help prevent falls? Wear shoes that: Do not have high heels. Have rubber bottoms. Are comfortable and fit you well. Are closed at the toe. Do not wear sandals. If you use a stepladder: Make sure that it is fully opened. Do not climb a closed stepladder. Make sure that both sides of the stepladder are locked into place. Ask someone to hold it for you, if possible. Clearly mark and make sure that you can see: Any grab bars or handrails. First and last steps. Where the edge of each step is. Use tools that help you move around (mobility aids)  if they are needed. These include: Canes. Walkers. Scooters. Crutches. Turn on the lights when you go into a dark area. Replace any light bulbs as soon as they burn out. Set up your furniture so you have a clear path. Avoid moving your furniture around. If any of your floors are uneven, fix them. If there are any pets around you, be aware of where they are. Review your medicines with your doctor. Some medicines can make you feel dizzy. This can increase your chance of falling. Ask your doctor what other things that you can do to help prevent falls. This information is not intended to replace advice given to you by your health care provider. Make sure you discuss any questions you have with your health care provider. Document Released: 08/12/2009 Document Revised: 03/23/2016 Document Reviewed: 11/20/2014 Elsevier Interactive Patient Education  2017 [employer].

## 2021-09-29 DIAGNOSIS — H21231 Degeneration of iris (pigmentary), right eye: Secondary | ICD-10-CM | POA: Diagnosis not present

## 2021-09-29 DIAGNOSIS — H402231 Chronic angle-closure glaucoma, bilateral, mild stage: Secondary | ICD-10-CM | POA: Diagnosis not present

## 2021-09-29 DIAGNOSIS — H40053 Ocular hypertension, bilateral: Secondary | ICD-10-CM | POA: Diagnosis not present

## 2021-09-29 LAB — HM DIABETES EYE EXAM

## 2021-09-30 ENCOUNTER — Encounter: Payer: Self-pay | Admitting: Nurse Practitioner

## 2021-12-05 ENCOUNTER — Encounter: Payer: Medicare HMO | Admitting: Nurse Practitioner

## 2021-12-06 ENCOUNTER — Other Ambulatory Visit: Payer: Self-pay

## 2021-12-07 ENCOUNTER — Encounter: Payer: Self-pay | Admitting: Nurse Practitioner

## 2021-12-07 ENCOUNTER — Ambulatory Visit (INDEPENDENT_AMBULATORY_CARE_PROVIDER_SITE_OTHER): Payer: Medicare HMO | Admitting: Nurse Practitioner

## 2021-12-07 VITALS — BP 138/70 | HR 72 | Temp 97.1°F | Ht 59.0 in | Wt 159.6 lb

## 2021-12-07 DIAGNOSIS — E785 Hyperlipidemia, unspecified: Secondary | ICD-10-CM

## 2021-12-07 DIAGNOSIS — J4520 Mild intermittent asthma, uncomplicated: Secondary | ICD-10-CM

## 2021-12-07 DIAGNOSIS — H409 Unspecified glaucoma: Secondary | ICD-10-CM | POA: Diagnosis not present

## 2021-12-07 DIAGNOSIS — N3941 Urge incontinence: Secondary | ICD-10-CM | POA: Diagnosis not present

## 2021-12-07 DIAGNOSIS — R202 Paresthesia of skin: Secondary | ICD-10-CM

## 2021-12-07 DIAGNOSIS — R200 Anesthesia of skin: Secondary | ICD-10-CM | POA: Diagnosis not present

## 2021-12-07 DIAGNOSIS — I10 Essential (primary) hypertension: Secondary | ICD-10-CM | POA: Diagnosis not present

## 2021-12-07 DIAGNOSIS — E559 Vitamin D deficiency, unspecified: Secondary | ICD-10-CM | POA: Diagnosis not present

## 2021-12-07 DIAGNOSIS — M8580 Other specified disorders of bone density and structure, unspecified site: Secondary | ICD-10-CM | POA: Diagnosis not present

## 2021-12-07 DIAGNOSIS — E119 Type 2 diabetes mellitus without complications: Secondary | ICD-10-CM

## 2021-12-07 MED ORDER — ALBUTEROL SULFATE HFA 108 (90 BASE) MCG/ACT IN AERS: 2.0000 | INHALATION_SPRAY | Freq: Four times a day (QID) | RESPIRATORY_TRACT | 0 refills | Status: DC | PRN

## 2021-12-07 MED ORDER — MIRABEGRON ER 25 MG PO TB24: 25.0000 mg | ORAL_TABLET | Freq: Every day | ORAL | 1 refills | Status: DC

## 2021-12-07 MED ORDER — SIMVASTATIN 40 MG PO TABS: 40.0000 mg | ORAL_TABLET | Freq: Every day | ORAL | 1 refills | Status: DC

## 2021-12-07 MED ORDER — FUROSEMIDE 20 MG PO TABS: 20.0000 mg | ORAL_TABLET | Freq: Every day | ORAL | 1 refills | Status: DC

## 2021-12-07 NOTE — Assessment & Plan Note (Signed)
Chronic, stable. Only uses albuterol as needed and uses it rarely. Albuterol refill sent to pharmacy. Follow up in 6 months or sooner with concerns.

## 2021-12-07 NOTE — Assessment & Plan Note (Signed)
Last and only DEXA showed osteopenia in 2001. Discussed repeat DEXA scan and she will get this with her next mammogram. Check vitamin D.

## 2021-12-07 NOTE — Progress Notes (Signed)
Established Patient Office Visit  Subjective:  Patient ID: Kayla Alvarez, female    DOB: 1934/05/08  Age: 88 y.o. MRN: [id number]  CC:  Chief Complaint  Patient presents with   Transitions Of Care    TOC. Pt c/o numbness/tingling feeling that starts in her left hand and goes up left arm     HPI Kayla Alvarez presents to establish care with a new provider.  Introduced to Designer, jewellery role and practice setting.  All questions answered.  Discussed provider/patient relationship and expectations.  She has noticed intermittent numbness and tingling in left forearm below her elbow to her hand for the last 2 months. She states the pain is worse in the evenings when she has been sitting for a while, or while resting her arm on the table. She rubs her forearm and it goes away. She denies any injury.   Blood pressure is well controlled at home. She checks it daily and is typically 130s/70s. She is taking furosemide daily. No side effects noted with medications. She denies chest pain, shortness of breath, and dizziness.   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   UTI 09/03/2008   VAGINITIS, ATROPHIC 06/26/2007   Venous insufficiency     Past Surgical History:  Procedure Laterality Date   CESAREAN SECTION     EYE SURGERY     HEMORRHOID SURGERY     TOOTH EXTRACTION      Family History  Problem Relation Age of Onset   Hypertension Mother    Stroke Mother    Heart disease Father    Hypertension Father    Breast cancer Sister        unsure of age   Hypertension Sister     Social History   Socioeconomic History   Marital status:  Widowed    Spouse name: Not on file   Number of children: Not on file   Years of education: Not on file   Highest education level: Not on file  Occupational History   Not on file  Tobacco Use   Smoking status: Former   Smokeless tobacco: Never  Vaping Use   Vaping Use: Never used  Substance and Sexual Activity   Alcohol use: No   Drug use: No   Sexual activity: Not on file  Other Topics Concern   Not on file  Social History Narrative   Not on file   Social Determinants of Health   Financial Resource Strain: Low Risk    Difficulty of Paying Living Expenses: Not hard at all  Food Insecurity: No Food Insecurity   Worried About Charity fundraiser in the Last Year: Never true   [place] in the Last Year: Never true  Transportation Needs: No Transportation Needs   Lack of Transportation (Medical): No   Lack of Transportation (Non-Medical): No  Physical Activity: Inactive   Days of Exercise per Week: 0 days   Minutes of Exercise per Session: 0 min  Stress: No Stress Concern Present   Feeling of Stress : Only a little  Social Connections: Moderately Isolated   Frequency of Communication  with Friends and Family: Twice a week   Frequency of Social Gatherings with Friends and Family: Three times a week   Attends Religious Services: More than 4 times per year   Active Member of Clubs or Organizations: No   Attends Archivist Meetings: Never   Marital Status: Widowed  Human resources officer Violence: Not At Risk   Fear of Current or Ex-Partner: No   Emotionally Abused: No   Physically Abused: No   Sexually Abused: No    Outpatient Medications Prior to Visit  Medication Sig Dispense Refill   ELDERBERRY PO Take by mouth.     albuterol (PROVENTIL HFA;VENTOLIN HFA) 108 (90 Base) MCG/ACT inhaler Inhale 2 puffs into the lungs every 6 (six) hours as needed. 1 Inhaler 0   furosemide (LASIX) 20 MG tablet Take 1 tablet by mouth once daily 90 tablet 0   mirabegron ER  (MYRBETRIQ) 25 MG TB24 tablet Take 1 tablet (25 mg total) by mouth daily. 90 tablet 1   simvastatin (ZOCOR) 40 MG tablet Take 1 tablet (40 mg total) by mouth at bedtime. 90 tablet 3   No facility-administered medications prior to visit.    No Known Allergies  ROS Review of Systems  Constitutional:  Positive for fatigue.  HENT: Negative.    Eyes: Negative.   Respiratory: Negative.    Cardiovascular: Negative.   Gastrointestinal: Negative.   Genitourinary: Negative.   Musculoskeletal: Negative.   Skin: Negative.   Neurological:  Positive for numbness (and tingling, intermittent to left forearm).  Psychiatric/Behavioral: Negative.       Objective:    Physical Exam Vitals and nursing note reviewed.  Constitutional:      General: She is not in acute distress.    Appearance: Normal appearance.  HENT:     Head: Normocephalic and atraumatic.  Eyes:     Conjunctiva/sclera: Conjunctivae normal.  Cardiovascular:     Rate and Rhythm: Normal rate and regular rhythm.     Pulses: Normal pulses.     Heart sounds: Normal heart sounds.  Pulmonary:     Effort: Pulmonary effort is normal.     Breath sounds: Normal breath sounds.  Abdominal:     Palpations: Abdomen is soft.     Tenderness: There is no abdominal tenderness.  Musculoskeletal:        General: No swelling, tenderness or deformity.     Cervical back: Normal range of motion. No tenderness.     Comments: Edema noted to right knee  Lymphadenopathy:     Cervical: No cervical adenopathy.  Skin:    General: Skin is warm and dry.     Findings: No bruising.  Neurological:     General: No focal deficit present.     Mental Status: She is alert and oriented to person, place, and time.  Psychiatric:        Mood and Affect: Mood normal.        Behavior: Behavior normal.        Thought Content: Thought content normal.        Judgment: Judgment normal.    BP 138/70    Pulse 72    Temp (!) 97.1 F (36.2 C) (Temporal)    Ht 4'  11" (1.499 m)    Wt 159 lb 9.6 oz (72.4 kg)    SpO2 94%    BMI 32.24 kg/m  Wt Readings from Last 3 Encounters:  12/07/21 159 lb 9.6 oz (72.4 kg)  11/18/20 166 lb 12.8 oz (  75.7 kg)  07/14/20 163 lb (73.9 kg)   Diabetic Foot Exam - Simple   Simple Foot Form Visual Inspection No deformities, no ulcerations, no other skin breakdown bilaterally: Yes Sensation Testing Intact to touch and monofilament testing bilaterally: Yes Pulse Check Posterior Tibialis and Dorsalis pulse intact bilaterally: Yes Comments     Health Maintenance Due  Topic Date Due   COVID-19 Vaccine (1) Never done   FOOT EXAM  11/09/2020   HEMOGLOBIN A1C  05/18/2021   URINE MICROALBUMIN  11/18/2021    There are no preventive care reminders to display for this patient.  Lab Results  Component Value Date   TSH 1.50 11/10/2019   Lab Results  Component Value Date   WBC 8.2 11/10/2019   HGB 14.6 03/07/2020   HCT 43.0 03/07/2020   MCV 90.5 11/10/2019   PLT 233.0 11/10/2019   Lab Results  Component Value Date   NA 140 11/18/2020   K 4.2 11/18/2020   CO2 28 11/18/2020   GLUCOSE 106 (H) 11/18/2020   BUN 14 11/18/2020   CREATININE 0.94 (H) 11/18/2020   BILITOT 0.6 11/18/2020   ALKPHOS 57 11/10/2019   AST 23 11/18/2020   ALT 24 11/18/2020   PROT 6.8 11/18/2020   ALBUMIN 4.1 11/10/2019   CALCIUM 9.6 11/18/2020   GFR 64.74 11/10/2019   Lab Results  Component Value Date   CHOL 195 11/18/2020   Lab Results  Component Value Date   HDL 61 11/18/2020   Lab Results  Component Value Date   LDLCALC 115 (H) 11/18/2020   Lab Results  Component Value Date   TRIG 90 11/18/2020   Lab Results  Component Value Date   CHOLHDL 3.2 11/18/2020   Lab Results  Component Value Date   HGBA1C 6.6 (H) 11/18/2020      Assessment & Plan:   Problem List Items Addressed This Visit       Cardiovascular and Mediastinum   HTN (hypertension) - Primary    Chronic, stable. Currently taking lasix 20mg  daily which  also helps with edema she has had in past. Continue current regimen. Check CMP, CBC, TSH today. Follow up in 6 months.       Relevant Medications   furosemide (LASIX) 20 MG tablet   simvastatin (ZOCOR) 40 MG tablet   Other Relevant Orders   CBC with Differential/Platelet   Comprehensive metabolic panel   TSH     Respiratory   Asthma, chronic    Chronic, stable. Only uses albuterol as needed and uses it rarely. Albuterol refill sent to pharmacy. Follow up in 6 months or sooner with concerns.       Relevant Medications   albuterol (VENTOLIN HFA) 108 (90 Base) MCG/ACT inhaler     Endocrine   Diabetes mellitus type 2, diet-controlled (HCC)    Chronic, controlled. Check A1C today and treat based on results. Foot exam normal. Check urine microalbumin today. She sees eye doctor routinely.       Relevant Medications   simvastatin (ZOCOR) 40 MG tablet   Other Relevant Orders   Microalbumin / creatinine urine ratio   Hemoglobin A1c     Musculoskeletal and Integument   Osteopenia    Last and only DEXA showed osteopenia in 2001. Discussed repeat DEXA scan and she will get this with her next mammogram. Check vitamin D.       Relevant Orders   DG Bone Density     Other   Hyperlipidemia    Check lipid  panel and adjust regimen based on results. She is currently taking simvastatin and would like to continue this as her sister has a history of stroke and is paralyzed on one side. Refill sent to the pharmacy.       Relevant Medications   furosemide (LASIX) 20 MG tablet   simvastatin (ZOCOR) 40 MG tablet   Other Relevant Orders   Lipid panel   Urinary incontinence, urge    Chronic, stable. Well controlled with myrbetriq. Refill sent to pharmacy. Follow up in 6 months.       Relevant Medications   mirabegron ER (MYRBETRIQ) 25 MG TB24 tablet   Glaucoma of both eyes    Chronic, follows with ophthalmology. She is currently taking eye drops for glaucoma, but doesn't remember the name.  Continue collaboration and recommendations.       Numbness and tingling in left arm    Symptoms consistent with cubital tunnel syndrome. Discussed some stretches she can do daily and an arm strap that she can wear during the day. Check vitamin B12 today. Follow up if symptoms don't improve or worsen.       Relevant Orders   Vitamin B12   Vitamin D deficiency    Check vitamin D level today and treat based on results.       Relevant Orders   VITAMIN D 25 Hydroxy (Vit-D Deficiency, Fractures)    Meds ordered this encounter  Medications   furosemide (LASIX) 20 MG tablet    Sig: Take 1 tablet (20 mg total) by mouth daily.    Dispense:  90 tablet    Refill:  1   mirabegron ER (MYRBETRIQ) 25 MG TB24 tablet    Sig: Take 1 tablet (25 mg total) by mouth daily.    Dispense:  90 tablet    Refill:  1   simvastatin (ZOCOR) 40 MG tablet    Sig: Take 1 tablet (40 mg total) by mouth at bedtime.    Dispense:  90 tablet    Refill:  1   albuterol (VENTOLIN HFA) 108 (90 Base) MCG/ACT inhaler    Sig: Inhale 2 puffs into the lungs every 6 (six) hours as needed.    Dispense:  1 each    Refill:  0    Follow-up: Return in about 6 months (around 06/06/2022) for HTN.    Charyl Dancer, NP

## 2021-12-07 NOTE — Assessment & Plan Note (Signed)
Chronic, stable. Well controlled with myrbetriq. Refill sent to pharmacy. Follow up in 6 months.

## 2021-12-07 NOTE — Assessment & Plan Note (Addendum)
Chronic, follows with ophthalmology. She is currently taking eye drops for glaucoma, but doesn't remember the name. Continue collaboration and recommendations.

## 2021-12-07 NOTE — Patient Instructions (Signed)
It was great to see you!  We are checking your blood work today and will call you with the results.   I have printed some stretches for your left arm.  Let's follow-up in 6 months, sooner if you have concerns.  If a referral was placed today, you will be contacted for an appointment. Please note that routine referrals can sometimes take up to 3-4 weeks to process. Please call our office if you haven't heard anything after this time frame.  Take care,  Vance Peper, NP

## 2021-12-07 NOTE — Assessment & Plan Note (Signed)
Check vitamin D level today and treat based on results.

## 2021-12-07 NOTE — Assessment & Plan Note (Signed)
Check lipid panel and adjust regimen based on results. She is currently taking simvastatin and would like to continue this as her sister has a history of stroke and is paralyzed on one side. Refill sent to the pharmacy.

## 2021-12-07 NOTE — Assessment & Plan Note (Addendum)
Chronic, controlled. Check A1C today and treat based on results. Foot exam normal. Check urine microalbumin today. She sees eye doctor routinely.

## 2021-12-07 NOTE — Assessment & Plan Note (Signed)
Symptoms consistent with cubital tunnel syndrome. Discussed some stretches she can do daily and an arm strap that she can wear during the day. Check vitamin B12 today. Follow up if symptoms don't improve or worsen.

## 2021-12-07 NOTE — Assessment & Plan Note (Signed)
Chronic, stable. Currently taking lasix 20mg  daily which also helps with edema she has had in past. Continue current regimen. Check CMP, CBC, TSH today. Follow up in 6 months.

## 2021-12-08 LAB — CBC WITH DIFFERENTIAL/PLATELET
Basophils Absolute: 0 10*3/uL (ref 0.0–0.1)
Basophils Relative: 0.5 % (ref 0.0–3.0)
Eosinophils Absolute: 0.2 10*3/uL (ref 0.0–0.7)
Eosinophils Relative: 2.2 % (ref 0.0–5.0)
HCT: 42.2 % (ref 36.0–46.0)
Hemoglobin: 14.1 g/dL (ref 12.0–15.0)
Lymphocytes Relative: 27.7 % (ref 12.0–46.0)
Lymphs Abs: 2.2 10*3/uL (ref 0.7–4.0)
MCHC: 33.5 g/dL (ref 30.0–36.0)
MCV: 90 fl (ref 78.0–100.0)
Monocytes Absolute: 0.7 10*3/uL (ref 0.1–1.0)
Monocytes Relative: 9.5 % (ref 3.0–12.0)
Neutro Abs: 4.7 10*3/uL (ref 1.4–7.7)
Neutrophils Relative %: 60.1 % (ref 43.0–77.0)
Platelets: 208 10*3/uL (ref 150.0–400.0)
RBC: 4.69 Mil/uL (ref 3.87–5.11)
RDW: 13.5 % (ref 11.5–15.5)
WBC: 7.9 10*3/uL (ref 4.0–10.5)

## 2021-12-08 LAB — COMPREHENSIVE METABOLIC PANEL
ALT: 18 U/L (ref 0–35)
AST: 18 U/L (ref 0–37)
Albumin: 4 g/dL (ref 3.5–5.2)
Alkaline Phosphatase: 52 U/L (ref 39–117)
BUN: 8 mg/dL (ref 6–23)
CO2: 34 mEq/L — ABNORMAL HIGH (ref 19–32)
Calcium: 9.2 mg/dL (ref 8.4–10.5)
Chloride: 104 mEq/L (ref 96–112)
Creatinine, Ser: 0.83 mg/dL (ref 0.40–1.20)
GFR: 64.4 mL/min (ref >60.00)
Glucose, Bld: 112 mg/dL — ABNORMAL HIGH (ref 70–99)
Potassium: 4.3 mEq/L (ref 3.5–5.1)
Sodium: 142 mEq/L (ref 135–145)
Total Bilirubin: 0.6 mg/dL (ref 0.2–1.2)
Total Protein: 6.4 g/dL (ref 6.0–8.3)

## 2021-12-08 LAB — LIPID PANEL
Cholesterol: 160 mg/dL (ref 0–200)
HDL: 63.1 mg/dL (ref >39.00)
LDL Cholesterol: 80 mg/dL (ref 0–99)
NonHDL: 96.54
Total CHOL/HDL Ratio: 3
Triglycerides: 82 mg/dL (ref 0.0–149.0)
VLDL: 16.4 mg/dL (ref 0.0–40.0)

## 2021-12-08 LAB — MICROALBUMIN / CREATININE URINE RATIO
Creatinine,U: 118.7 mg/dL
Microalb Creat Ratio: 1.1 mg/g (ref 0.0–30.0)
Microalb, Ur: 1.3 mg/dL (ref 0.0–1.9)

## 2021-12-08 LAB — TSH: TSH: 1.23 u[IU]/mL (ref 0.35–5.50)

## 2021-12-08 LAB — VITAMIN B12: Vitamin B-12: 175 pg/mL — ABNORMAL LOW (ref 211–911)

## 2021-12-08 LAB — HEMOGLOBIN A1C: Hgb A1c MFr Bld: 6.8 % — ABNORMAL HIGH (ref 4.6–6.5)

## 2021-12-08 LAB — VITAMIN D 25 HYDROXY (VIT D DEFICIENCY, FRACTURES): VITD: 19.23 ng/mL — ABNORMAL LOW (ref 30.00–100.00)

## 2021-12-09 NOTE — Progress Notes (Signed)
Called and informed patient of results and provider instructions. Patient voiced understanding.

## 2021-12-19 ENCOUNTER — Telehealth: Payer: Self-pay | Admitting: Nurse Practitioner

## 2021-12-19 NOTE — Telephone Encounter (Signed)
Pt called with questions about her Zocor... said she normally takes 20 ml and is confused why it's 40 now.

## 2021-12-20 NOTE — Telephone Encounter (Signed)
Called and spoke to patient, she will continue to take 1/2 tablet of 40mg  Zocor

## 2021-12-28 ENCOUNTER — Other Ambulatory Visit: Payer: Self-pay | Admitting: Nurse Practitioner

## 2021-12-28 DIAGNOSIS — Z1231 Encounter for screening mammogram for malignant neoplasm of breast: Secondary | ICD-10-CM

## 2022-02-01 DIAGNOSIS — L814 Other melanin hyperpigmentation: Secondary | ICD-10-CM | POA: Diagnosis not present

## 2022-02-01 DIAGNOSIS — L821 Other seborrheic keratosis: Secondary | ICD-10-CM | POA: Diagnosis not present

## 2022-02-01 DIAGNOSIS — L570 Actinic keratosis: Secondary | ICD-10-CM | POA: Diagnosis not present

## 2022-02-01 DIAGNOSIS — Z85828 Personal history of other malignant neoplasm of skin: Secondary | ICD-10-CM | POA: Diagnosis not present

## 2022-02-01 DIAGNOSIS — D1801 Hemangioma of skin and subcutaneous tissue: Secondary | ICD-10-CM | POA: Diagnosis not present

## 2022-02-01 DIAGNOSIS — D225 Melanocytic nevi of trunk: Secondary | ICD-10-CM | POA: Diagnosis not present

## 2022-04-20 DIAGNOSIS — H21231 Degeneration of iris (pigmentary), right eye: Secondary | ICD-10-CM | POA: Diagnosis not present

## 2022-04-20 DIAGNOSIS — H402231 Chronic angle-closure glaucoma, bilateral, mild stage: Secondary | ICD-10-CM | POA: Diagnosis not present

## 2022-04-20 DIAGNOSIS — H40053 Ocular hypertension, bilateral: Secondary | ICD-10-CM | POA: Diagnosis not present

## 2022-04-24 ENCOUNTER — Encounter: Payer: Self-pay | Admitting: Nurse Practitioner

## 2022-04-24 ENCOUNTER — Ambulatory Visit (INDEPENDENT_AMBULATORY_CARE_PROVIDER_SITE_OTHER): Payer: Medicare HMO | Admitting: Nurse Practitioner

## 2022-04-24 VITALS — BP 121/59 | HR 72 | Temp 97.5°F | Ht 59.0 in | Wt 161.0 lb

## 2022-04-24 DIAGNOSIS — R202 Paresthesia of skin: Secondary | ICD-10-CM

## 2022-04-24 DIAGNOSIS — E538 Deficiency of other specified B group vitamins: Secondary | ICD-10-CM

## 2022-04-25 DIAGNOSIS — R202 Paresthesia of skin: Secondary | ICD-10-CM | POA: Insufficient documentation

## 2022-04-25 DIAGNOSIS — E538 Deficiency of other specified B group vitamins: Secondary | ICD-10-CM | POA: Insufficient documentation

## 2022-04-25 LAB — VITAMIN B12: Vitamin B-12: 731 pg/mL (ref 211–911)

## 2022-04-25 NOTE — Assessment & Plan Note (Signed)
She has started a vitamin B12 supplement OTC. Will recheck vitamin B12 levels today and adjust regimen based on results.

## 2022-05-01 NOTE — Progress Notes (Signed)
Called and informed patient of results and provider instructions. Patient voiced understanding. Sw, cma

## 2022-05-08 ENCOUNTER — Ambulatory Visit
Admission: RE | Admit: 2022-05-08 | Discharge: 2022-05-08 | Disposition: A | Discharge status: Home / Self Care | Payer: Medicare HMO | Source: Ambulatory Visit | Attending: Nurse Practitioner | Admitting: Nurse Practitioner

## 2022-05-08 DIAGNOSIS — R202 Paresthesia of skin: Secondary | ICD-10-CM

## 2022-05-08 DIAGNOSIS — M4312 Spondylolisthesis, cervical region: Secondary | ICD-10-CM | POA: Diagnosis not present

## 2022-05-08 DIAGNOSIS — I6782 Cerebral ischemia: Secondary | ICD-10-CM | POA: Diagnosis not present

## 2022-05-08 DIAGNOSIS — I639 Cerebral infarction, unspecified: Secondary | ICD-10-CM | POA: Diagnosis not present

## 2022-05-08 DIAGNOSIS — G319 Degenerative disease of nervous system, unspecified: Secondary | ICD-10-CM | POA: Diagnosis not present

## 2022-05-08 MED ORDER — GADOBENATE DIMEGLUMINE 529 MG/ML IV SOLN
15.0000 mL | Freq: Once | INTRAVENOUS | Status: AC | PRN
  Administered 2022-05-08: 15 mL via INTRAVENOUS

## 2022-05-12 ENCOUNTER — Other Ambulatory Visit: Payer: Self-pay | Admitting: Nurse Practitioner

## 2022-05-12 DIAGNOSIS — R202 Paresthesia of skin: Secondary | ICD-10-CM

## 2022-05-12 NOTE — Progress Notes (Signed)
After MRI of head unrevealing for tingling and pain across left side of her head, will place referral to neurology.

## 2022-05-12 NOTE — Progress Notes (Signed)
Called and informed patient of results and provider instructions. Patient voiced understanding. Pt voiced understanding. Pt okay with being referred to a neurologist. Sw, cma

## 2022-05-26 ENCOUNTER — Ambulatory Visit
Admission: RE | Admit: 2022-05-26 | Discharge: 2022-05-26 | Disposition: A | Discharge status: Home / Self Care | Payer: Medicare HMO | Source: Ambulatory Visit | Attending: Nurse Practitioner | Admitting: Nurse Practitioner

## 2022-05-26 ENCOUNTER — Ambulatory Visit: Payer: Medicare HMO

## 2022-05-26 DIAGNOSIS — Z1231 Encounter for screening mammogram for malignant neoplasm of breast: Secondary | ICD-10-CM

## 2022-05-26 DIAGNOSIS — M8589 Other specified disorders of bone density and structure, multiple sites: Secondary | ICD-10-CM | POA: Diagnosis not present

## 2022-05-26 DIAGNOSIS — Z780 Asymptomatic menopausal state: Secondary | ICD-10-CM | POA: Diagnosis not present

## 2022-05-26 DIAGNOSIS — M8580 Other specified disorders of bone density and structure, unspecified site: Secondary | ICD-10-CM

## 2022-05-31 NOTE — Progress Notes (Signed)
Called and ldvm regarding mammo results. Sw, cma

## 2022-07-05 ENCOUNTER — Telehealth: Payer: Self-pay | Admitting: Nurse Practitioner

## 2022-07-05 NOTE — Telephone Encounter (Signed)
Left message for patient to call back and schedule Medicare Annual Wellness Visit (AWV).   Please offer to do virtually or by telephone.  Left office number and my jabber [phone].  Last AWV:06/23/2021  Please schedule at anytime with Nurse Health Advisor.

## 2022-07-10 ENCOUNTER — Encounter: Payer: Self-pay | Admitting: Diagnostic Neuroimaging

## 2022-07-10 ENCOUNTER — Ambulatory Visit: Payer: Medicare HMO | Admitting: Diagnostic Neuroimaging

## 2022-07-10 VITALS — BP 154/75 | HR 68 | Ht 60.0 in | Wt 158.0 lb

## 2022-07-10 DIAGNOSIS — G243 Spasmodic torticollis: Secondary | ICD-10-CM

## 2022-07-10 DIAGNOSIS — M5481 Occipital neuralgia: Secondary | ICD-10-CM

## 2022-07-10 NOTE — Progress Notes (Signed)
[facility]  PATIENT: Kayla Alvarez DOB: 1934/10/17  REFERRING CLINICIAN: Charyl Dancer, NP HISTORY FROM: patient REASON FOR VISIT: new consult   HISTORICAL  CHIEF COMPLAINT:  Chief Complaint  Patient presents with   Numbness    RM 6 alone Pt is well, states she feels like something is in her head crawling for a few yrs. It has worsen recently     HISTORY OF PRESENT ILLNESS:   88 year old female here for evaluation of abnormal sensation left scalp.  Symptoms started about 4 to 5 years ago.  Has intermittent tingling and crawling sensation in the left scalp starting in the left posterior cervical and occipital region radiating up the scalp on the left side.  Symptoms can last a few minutes at a time.  Symptoms may happen a few times a week.  No significant pain or discomfort.  Also has some tightness in her neck.  Has noticed some changes on her left neck, arm and leg for many years going back to her late 72s early 89s.  Has been having some issues with swelling in her ankles and knees.  REVIEW OF SYSTEMS: Full 14 system review of systems performed and negative with exception of: as per HPI.  ALLERGIES: No Known Allergies  HOME MEDICATIONS: Outpatient Medications Prior to Visit  Medication Sig Dispense Refill   albuterol (VENTOLIN HFA) 108 (90 Base) MCG/ACT inhaler Inhale 2 puffs into the lungs every 6 (six) hours as needed. 1 each 0   ELDERBERRY PO Take by mouth.     furosemide (LASIX) 20 MG tablet Take 1 tablet (20 mg total) by mouth daily. 90 tablet 1   mirabegron ER (MYRBETRIQ) 25 MG TB24 tablet Take 1 tablet (25 mg total) by mouth daily. 90 tablet 1   simvastatin (ZOCOR) 40 MG tablet Take 1 tablet (40 mg total) by mouth at bedtime. 90 tablet 1   vitamin B-12 (CYANOCOBALAMIN) 1000 MCG tablet Take 1,000 mcg by mouth daily.     No facility-administered medications prior to visit.    PAST MEDICAL HISTORY: Past Medical History:  Diagnosis Date    ALLERGIC RHINITIS 06/05/2007   BREAST LUMP 05/19/2010   CONTACT DERMATITIS&OTH ECZEMA DUE OTH SPEC AGENT 02/02/2010   Diabetes mellitus without complication ([place])    Edema 07/19/2009   Glaucoma    HERPES ZOSTER 11/28/2009   HYPERLIPIDEMIA 06/05/2007   Hypertension    RENAL INSUFFICIENCY 06/05/2007   Retinal hemorrhage of both eyes 10/14/2015   UTI 09/03/2008   VAGINITIS, ATROPHIC 06/26/2007   Venous insufficiency     PAST SURGICAL HISTORY: Past Surgical History:  Procedure Laterality Date   CESAREAN SECTION     EYE SURGERY     HEMORRHOID SURGERY     TOOTH EXTRACTION      FAMILY HISTORY: Family History  Problem Relation Age of Onset   Hypertension Mother    Stroke Mother    Heart disease Father    Hypertension Father    Breast cancer Sister        unsure of age   Hypertension Sister     SOCIAL HISTORY: Social History   Socioeconomic History   Marital status: Widowed    Spouse name: Not on file   Number of children: Not on file   Years of education: Not on file   Highest education level: Not on file  Occupational History   Not on file  Tobacco Use   Smoking status: Former   Smokeless tobacco: Never  Vaping Use   Vaping Use: Never used  Substance and Sexual Activity   Alcohol use: No   Drug use: No   Sexual activity: Not on file  Other Topics Concern   Not on file  Social History Narrative   Not on file   Social Determinants of Health   Financial Resource Strain: Low Risk  (06/23/2021)   Overall Financial Resource Strain (CARDIA)    Difficulty of Paying Living Expenses: Not hard at all  Food Insecurity: No Food Insecurity (06/23/2021)   Hunger Vital Sign    Worried About Running Out of Food in the Last Year: Never true    Ran Out of Food in the Last Year: Never true  Transportation Needs: No Transportation Needs (06/23/2021)   PRAPARE - Hydrologist (Medical): No    Lack of Transportation (Non-Medical): No  Physical  Activity: Inactive (06/23/2021)   Exercise Vital Sign    Days of Exercise per Week: 0 days    Minutes of Exercise per Session: 0 min  Stress: No Stress Concern Present (06/23/2021)   [place]    Feeling of Stress : Only a little  Social Connections: Moderately Isolated (06/23/2021)   Social Connection and Isolation Panel [NHANES]    Frequency of Communication with Friends and Family: Twice a week    Frequency of Social Gatherings with Friends and Family: Three times a week    Attends Religious Services: More than 4 times per year    Active Member of Clubs or Organizations: No    Attends Archivist Meetings: Never    Marital Status: Widowed  Intimate Partner Violence: Not At Risk (06/23/2021)   Humiliation, Afraid, Rape, and Kick questionnaire    Fear of Current or Ex-Partner: No    Emotionally Abused: No    Physically Abused: No    Sexually Abused: No     PHYSICAL EXAM  GENERAL EXAM/CONSTITUTIONAL: Vitals:  Vitals:   07/10/22 0950  BP: (!) 154/75  Pulse: 68  Weight: 158 lb (71.7 kg)  Height: 5' (1.524 m)   Body mass index is 30.86 kg/m. Wt Readings from Last 3 Encounters:  07/10/22 158 lb (71.7 kg)  04/24/22 161 lb (73 kg)  12/07/21 159 lb 9.6 oz (72.4 kg)   Patient is in no distress; well developed, nourished and groomed NECK TURNED TO RIGHT; DECR ROM TO LEFT SIDE; LEFT SHOULDER ELEVATION  CARDIOVASCULAR: Examination of carotid arteries is normal; no carotid bruits Regular rate and rhythm, no murmurs Examination of peripheral vascular system by observation and palpation is normal  EYES: Ophthalmoscopic exam of optic discs and posterior segments is normal; no papilledema or hemorrhages No results found.  MUSCULOSKELETAL: Gait, strength, tone, movements noted in Neurologic exam below  NEUROLOGIC: MENTAL STATUS:      No data to display         awake, alert, oriented to person,  place and time recent and remote memory intact normal attention and concentration language fluent, comprehension intact, naming intact fund of knowledge appropriate  CRANIAL NERVE:  2nd - no papilledema on fundoscopic exam 2nd, 3rd, 4th, 6th - pupils equal and reactive to light, visual fields full to confrontation, extraocular muscles intact, no nystagmus 5th - facial sensation symmetric 7th - facial strength symmetric 8th - hearing intact 9th - palate elevates symmetrically, uvula midline 11th - shoulder shrug symmetric 12th - tongue protrusion midline  MOTOR:  normal bulk and tone,  full strength in the BUE, BLE  SENSORY:  normal and symmetric to light touch, temperature, vibration  COORDINATION:  finger-nose-finger, fine finger movements normal  REFLEXES:  deep tendon reflexes TRACE and symmetric  GAIT/STATION:  narrow based gait; MILD KYPHOSIS, SCOLIOSIS     DIAGNOSTIC DATA (LABS, IMAGING, TESTING) - I reviewed patient records, labs, notes, testing and imaging myself where available.  Lab Results  Component Value Date   WBC 7.9 12/07/2021   HGB 14.1 12/07/2021   HCT 42.2 12/07/2021   MCV 90.0 12/07/2021   PLT 208.0 12/07/2021      Component Value Date/Time   NA 142 12/07/2021 1509   K 4.3 12/07/2021 1509   CL 104 12/07/2021 1509   CO2 34 (H) 12/07/2021 1509   GLUCOSE 112 (H) 12/07/2021 1509   BUN 8 12/07/2021 1509   CREATININE 0.83 12/07/2021 1509   CREATININE 0.94 (H) 11/18/2020 1513   CALCIUM 9.2 12/07/2021 1509   PROT 6.4 12/07/2021 1509   ALBUMIN 4.0 12/07/2021 1509   AST 18 12/07/2021 1509   ALT 18 12/07/2021 1509   ALKPHOS 52 12/07/2021 1509   BILITOT 0.6 12/07/2021 1509   GFRNONAA 65.93 08/01/2010 1119   GFRAA 79 07/16/2008 1019   Lab Results  Component Value Date   CHOL 160 12/07/2021   HDL 63.10 12/07/2021   LDLCALC 80 12/07/2021   LDLDIRECT 145.8 09/15/2013   TRIG 82.0 12/07/2021   CHOLHDL 3 12/07/2021   Lab Results  Component  Value Date   HGBA1C 6.8 (H) 12/07/2021   Lab Results  Component Value Date   SKAJGOTL57 262 04/24/2022   Lab Results  Component Value Date   TSH 1.23 12/07/2021    05/08/22 MRI brain [I reviewed images myself and agree with interpretation; scoliosis of head/neck convex to right. -VRP]  1. No evidence of acute intracranial abnormality. 2. Chronic small-vessel ischemic changes within the cerebral white matter and pons, mild for age. 3. 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 4. Mild generalized cerebral and cerebellar atrophy.    ASSESSMENT AND PLAN  88 y.o. year old female here with:   Dx:  1. Cervico-occipital neuralgia of left side   2. Cervical dystonia     PLAN:  LEFT OCCIPITAL NEURALGIA (since last 4-5+ years) - mild symptoms; no pain; only few minutes every few days; rec conservative mgmt and monitoring  CERVICAL DYSTONIA (head turned to right; decr ROM to the left) - conservative mgmt; consider massage, acupuncture, PT treatments if needed  Return for pending if symptoms worsen or fail to improve, pending test results.    Penni Bombard, MD 0/35/5974, 16:38 AM Certified in Neurology, Neurophysiology and Neuroimaging  [facility] [address], [place], [place] [address] [phone]

## 2022-07-10 NOTE — Patient Instructions (Signed)
LEFT OCCIPITAL NEURALGIA (since last 4-5+ years) - mild symptoms; no pain; only few minutes every few days; rec conservative mgmt and monitoring  CERVICAL DYSTONIA (head turned to right; decr ROM to the left) - conservative mgmt

## 2022-07-11 ENCOUNTER — Ambulatory Visit (INDEPENDENT_AMBULATORY_CARE_PROVIDER_SITE_OTHER): Payer: Medicare HMO

## 2022-07-11 VITALS — Ht 60.0 in | Wt 153.0 lb

## 2022-07-11 DIAGNOSIS — Z0000 Encounter for general adult medical examination without abnormal findings: Secondary | ICD-10-CM

## 2022-07-11 NOTE — Patient Instructions (Signed)
Kayla Alvarez , Thank you for taking time to come for your Medicare Wellness Visit. I appreciate your ongoing commitment to your health goals. Please review the following plan we discussed and let me know if I can assist you in the future.   Screening recommendations/referrals: Colonoscopy: no longer required  Mammogram: no longer required  Bone Density: 05/26/2022 Recommended yearly ophthalmology/optometry visit for glaucoma screening and checkup Recommended yearly dental visit for hygiene and checkup  Vaccinations: Influenza vaccine: due in the fall  Pneumococcal vaccine: completed  Tdap vaccine: due  Shingles vaccine: will consider    Covid-19:completed   Advanced directives: Advance directive discussed with you today. I have provided a copy for you to complete at home and have notarized. Once this is complete please bring a copy in to our office so we can scan it into your chart.   Conditions/risks identified: Aim for 30 minutes of exercise or brisk walking, 6-8 glasses of water, and 5 servings of fruits and vegetables each day.   Next appointment: Follow up in one year for your annual wellness visit    Preventive Care 65 Years and Older, Female Preventive care refers to lifestyle choices and visits with your health care provider that can promote health and wellness. What does preventive care include? A yearly physical exam. This is also called an annual well check. Dental exams once or twice a year. Routine eye exams. Ask your health care provider how often you should have your eyes checked. Personal lifestyle choices, including: Daily care of your teeth and gums. Regular physical activity. Eating a healthy diet. Avoiding tobacco and drug use. Limiting alcohol use. Practicing safe sex. Taking low-dose aspirin every day. Taking vitamin and mineral supplements as recommended by your health care provider. What happens during an annual well check? The services and screenings done  by your health care provider during your annual well check will depend on your age, overall health, lifestyle risk factors, and family history of disease. Counseling  Your health care provider may ask you questions about your: Alcohol use. Tobacco use. Drug use. Emotional well-being. Home and relationship well-being. Sexual activity. Eating habits. History of falls. Memory and ability to understand (cognition). Work and work Statistician. Reproductive health. Screening  You may have the following tests or measurements: Height, weight, and BMI. Blood pressure. Lipid and cholesterol levels. These may be checked every 5 years, or more frequently if you are over 14 years old. Skin check. Lung cancer screening. You may have this screening every year starting at age 31 if you have a 30-pack-year history of smoking and currently smoke or have quit within the past 15 years. Fecal occult blood test (FOBT) of the stool. You may have this test every year starting at age 71. Flexible sigmoidoscopy or colonoscopy. You may have a sigmoidoscopy every 5 years or a colonoscopy every 10 years starting at age 51. Hepatitis C blood test. Hepatitis B blood test. Sexually transmitted disease (STD) testing. Diabetes screening. This is done by checking your blood sugar (glucose) after you have not eaten for a while (fasting). You may have this done every 1-3 years. Bone density scan. This is done to screen for osteoporosis. You may have this done starting at age 32. Mammogram. This may be done every 1-2 years. Talk to your health care provider about how often you should have regular mammograms. Talk with your health care provider about your test results, treatment options, and if necessary, the need for more tests. Vaccines  Your  health care provider may recommend certain vaccines, such as: Influenza vaccine. This is recommended every year. Tetanus, diphtheria, and acellular pertussis (Tdap, Td) vaccine. You  may need a Td booster every 10 years. Zoster vaccine. You may need this after age 87. Pneumococcal 13-valent conjugate (PCV13) vaccine. One dose is recommended after age 77. Pneumococcal polysaccharide (PPSV23) vaccine. One dose is recommended after age 52. Talk to your health care provider about which screenings and vaccines you need and how often you need them. This information is not intended to replace advice given to you by your health care provider. Make sure you discuss any questions you have with your health care provider. Document Released: 11/12/2015 Document Revised: 07/05/2016 Document Reviewed: 08/17/2015 Elsevier Interactive Patient Education  2017 [place] Prevention in the Home Falls can cause injuries. They can happen to people of all ages. There are many things you can do to make your home safe and to help prevent falls. What can I do on the outside of my home? Regularly fix the edges of walkways and driveways and fix any cracks. Remove anything that might make you trip as you walk through a door, such as a raised step or threshold. Trim any bushes or trees on the path to your home. Use bright outdoor lighting. Clear any walking paths of anything that might make someone trip, such as rocks or tools. Regularly check to see if handrails are loose or broken. Make sure that both sides of any steps have handrails. Any raised decks and porches should have guardrails on the edges. Have any leaves, snow, or ice cleared regularly. Use sand or salt on walking paths during winter. Clean up any spills in your garage right away. This includes oil or grease spills. What can I do in the bathroom? Use night lights. Install grab bars by the toilet and in the tub and shower. Do not use towel bars as grab bars. Use non-skid mats or decals in the tub or shower. If you need to sit down in the shower, use a plastic, non-slip stool. Keep the floor dry. Clean up any water that spills  on the floor as soon as it happens. Remove soap buildup in the tub or shower regularly. Attach bath mats securely with double-sided non-slip rug tape. Do not have throw rugs and other things on the floor that can make you trip. What can I do in the bedroom? Use night lights. Make sure that you have a light by your bed that is easy to reach. Do not use any sheets or blankets that are too big for your bed. They should not hang down onto the floor. Have a firm chair that has side arms. You can use this for support while you get dressed. Do not have throw rugs and other things on the floor that can make you trip. What can I do in the kitchen? Clean up any spills right away. Avoid walking on wet floors. Keep items that you use a lot in easy-to-reach places. If you need to reach something above you, use a strong step stool that has a grab bar. Keep electrical cords out of the way. Do not use floor polish or wax that makes floors slippery. If you must use wax, use non-skid floor wax. Do not have throw rugs and other things on the floor that can make you trip. What can I do with my stairs? Do not leave any items on the stairs. Make sure that there are handrails  on both sides of the stairs and use them. Fix handrails that are broken or loose. Make sure that handrails are as long as the stairways. Check any carpeting to make sure that it is firmly attached to the stairs. Fix any carpet that is loose or worn. Avoid having throw rugs at the top or bottom of the stairs. If you do have throw rugs, attach them to the floor with carpet tape. Make sure that you have a light switch at the top of the stairs and the bottom of the stairs. If you do not have them, ask someone to add them for you. What else can I do to help prevent falls? Wear shoes that: Do not have high heels. Have rubber bottoms. Are comfortable and fit you well. Are closed at the toe. Do not wear sandals. If you use a stepladder: Make  sure that it is fully opened. Do not climb a closed stepladder. Make sure that both sides of the stepladder are locked into place. Ask someone to hold it for you, if possible. Clearly mark and make sure that you can see: Any grab bars or handrails. First and last steps. Where the edge of each step is. Use tools that help you move around (mobility aids) if they are needed. These include: Canes. Walkers. Scooters. Crutches. Turn on the lights when you go into a dark area. Replace any light bulbs as soon as they burn out. Set up your furniture so you have a clear path. Avoid moving your furniture around. If any of your floors are uneven, fix them. If there are any pets around you, be aware of where they are. Review your medicines with your doctor. Some medicines can make you feel dizzy. This can increase your chance of falling. Ask your doctor what other things that you can do to help prevent falls. This information is not intended to replace advice given to you by your health care provider. Make sure you discuss any questions you have with your health care provider. Document Released: 08/12/2009 Document Revised: 03/23/2016 Document Reviewed: 11/20/2014 Elsevier Interactive Patient Education  2017 [employer].

## 2022-07-11 NOTE — Progress Notes (Signed)
Subjective:   Kayla Alvarez is a 88 y.o. female who presents for Medicare Annual (Subsequent) preventive examination.   Virtual Visit via Telephone Note  I connected with  Kayla Alvarez on 07/11/22 at  1:15 PM EDT by telephone and verified that I am speaking with the correct person using two identifiers.  Location: Patient: Home  Provider: [place]  Persons participating in the virtual visit: patient/Nurse Health Advisor   I discussed the limitations, risks, security and privacy concerns of performing an evaluation and management service by telephone and the availability of in person appointments. The patient expressed understanding and agreed to proceed.  Interactive audio and video telecommunications were attempted between this nurse and patient, however failed, due to patient having technical difficulties OR patient did not have access to video capability.  We continued and completed visit with audio only.  Some vital signs may be absent or patient reported.   Daphane Shepherd, LPN  Review of Systems     Cardiac Risk Factors include: advanced age (>47mn, >>50women);hypertension     Objective:    Today's Vitals   07/11/22 1328  Weight: 153 lb (69.4 kg)  Height: 5' (1.524 m)   Body mass index is 29.88 kg/m.     07/11/2022    1:31 PM 06/23/2021   10:56 AM 05/05/2020    1:05 PM 03/07/2020   12:41 PM  Advanced Directives  Does Patient Have a Medical Advance Directive? No No No No  Would patient like information on creating a medical advance directive? No - Patient declined No - Patient declined No - Patient declined No - Patient declined    Current Medications (verified) Outpatient Encounter Medications as of 07/11/2022  Medication Sig   albuterol (VENTOLIN HFA) 108 (90 Base) MCG/ACT inhaler Inhale 2 puffs into the lungs every 6 (six) hours as needed.   ELDERBERRY PO Take by mouth.   furosemide (LASIX) 20 MG tablet Take 1 tablet (20 mg total) by mouth daily.   mirabegron  ER (MYRBETRIQ) 25 MG TB24 tablet Take 1 tablet (25 mg total) by mouth daily.   simvastatin (ZOCOR) 40 MG tablet Take 1 tablet (40 mg total) by mouth at bedtime.   vitamin B-12 (CYANOCOBALAMIN) 1000 MCG tablet Take 1,000 mcg by mouth daily.   No facility-administered encounter medications on file as of 07/11/2022.    Allergies (verified) Patient has no known allergies.   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   UTI 09/03/2008   VAGINITIS, ATROPHIC 06/26/2007   Venous insufficiency    Past Surgical History:  Procedure Laterality Date   CESAREAN SECTION     EYE SURGERY     HEMORRHOID SURGERY     TOOTH EXTRACTION     Family History  Problem Relation Age of Onset   Hypertension Mother    Stroke Mother    Heart disease Father    Hypertension Father    Breast cancer Sister        unsure of age   Hypertension Sister    Social History   Socioeconomic History   Marital status: Widowed    Spouse name:  Not on file   Number of children: Not on file   Years of education: Not on file   Highest education level: Not on file  Occupational History   Not on file  Tobacco Use   Smoking status: Former   Smokeless tobacco: Never  Vaping Use   Vaping Use: Never used  Substance and Sexual Activity   Alcohol use: No   Drug use: No   Sexual activity: Not on file  Other Topics Concern   Not on file  Social History Narrative   Not on file   Social Determinants of Health   Financial Resource Strain: Low Risk  (07/11/2022)   Overall Financial Resource Strain (CARDIA)    Difficulty of Paying Living Expenses: Not hard at all  Food  Insecurity: No Food Insecurity (07/11/2022)   Hunger Vital Sign    Worried About Running Out of Food in the Last Year: Never true    [place] in the Last Year: Never true  Transportation Needs: No Transportation Needs (07/11/2022)   PRAPARE - Hydrologist (Medical): No    Lack of Transportation (Non-Medical): No  Physical Activity: Inactive (07/11/2022)   Exercise Vital Sign    Days of Exercise per Week: 0 days    Minutes of Exercise per Session: 0 min  Stress: No Stress Concern Present (07/11/2022)   [place]    Feeling of Stress : Not at all  Social Connections: Moderately Isolated (07/11/2022)   Social Connection and Isolation Panel [NHANES]    Frequency of Communication with Friends and Family: More than three times a week    Frequency of Social Gatherings with Friends and Family: More than three times a week    Attends Religious Services: More than 4 times per year    Active Member of [employer] or Organizations: No    Attends Archivist Meetings: Never    Marital Status: Widowed    Tobacco Counseling Counseling given: Not Answered   Clinical Intake:  Pre-visit preparation completed: Yes  Pain : No/denies pain     Nutritional Risks: None Diabetes: No  How often do you need to have someone help you when you read instructions, pamphlets, or other written materials from your doctor or pharmacy?: 1 - Never  Diabetic?no   Interpreter Needed?: No  Information entered by :: Jadene Pierini, LPN   Activities of Daily Living    07/11/2022    1:32 PM  In your present state of health, do you have any difficulty performing the following activities:  Hearing? 0  Vision? 0  Difficulty concentrating or making decisions? 0  Walking or climbing stairs? 0  Dressing or bathing? 0  Doing errands, shopping? 0  Preparing Food and eating ? N  Using the Toilet? N  In the past  six months, have you accidently leaked urine? N  Do you have problems with loss of bowel control? N  Managing your Medications? N  Managing your Finances? N  Housekeeping or managing your Housekeeping? N    Patient Care Team: Charyl Dancer, NP as PCP - General (Internal Medicine) Sable Feil, MD as Consulting Physician (Gastroenterology) Rolm Bookbinder, MD as Consulting Physician (Dermatology) Monna Fam, MD as Consulting Physician (Ophthalmology) Monna Fam, MD as Consulting Physician (Ophthalmology)  Indicate any recent Medical Services you may have received from other than [facility] providers in the past year (date may be approximate).  Assessment:   This is a routine wellness examination for Kayla Alvarez.  Hearing/Vision screen Vision Screening - Comments:: Annual eye exams wears glasses   Dietary issues and exercise activities discussed: Current Exercise Habits: The patient does not participate in regular exercise at present   Goals Addressed             This Visit's Progress    Patient Stated   On track    Would like to lose some weight by watching diet & increasing activity       Depression Screen    07/11/2022    1:30 PM 04/24/2022    2:32 PM 12/07/2021    3:10 PM 06/23/2021   11:11 AM 05/05/2020   12:50 PM 11/10/2019    9:59 AM 11/05/2017   11:30 AM  PHQ 2/9 Scores  PHQ - 2 Score 0 0 0 0 0 0 0    Fall Risk    07/11/2022    1:29 PM 04/24/2022    2:32 PM 06/23/2021   10:56 AM 05/05/2020   12:49 PM 11/10/2019    9:59 AM  [place] in the past year? 0 0 0 0 0  Number falls in past yr: 0 0 0 0   Injury with Fall? 0 0 0 0   Risk for fall due to : No Fall Risks      Follow up Falls prevention discussed  Falls evaluation completed;Falls prevention discussed Falls prevention discussed     FALL RISK PREVENTION PERTAINING TO THE HOME:  Any stairs in or around the home? No  If so, are there any without handrails? No  Home free of loose throw rugs  in walkways, pet beds, electrical cords, etc? Yes  Adequate lighting in your home to reduce risk of falls? Yes   ASSISTIVE DEVICES UTILIZED TO PREVENT FALLS:  Life alert? No  Use of a cane, walker or w/c? No  Grab bars in the bathroom? No  Shower chair or bench in shower? No  Elevated toilet seat or a handicapped toilet? No          07/11/2022    1:32 PM 05/05/2020   12:51 PM  6CIT Screen  What Year? 0 points 0 points  What month? 0 points 0 points  What time? 0 points 0 points  Count back from 20 0 points 0 points  Months in reverse 0 points 0 points  Repeat phrase 0 points 2 points  Total Score 0 points 2 points    Immunizations Immunization History  Administered Date(s) Administered   Influenza Whole 10/30/2004   Pneumococcal Polysaccharide-23 10/30/2001, 06/26/2007   Td 10/30/1998, 07/19/2009    TDAP status: Due, Education has been provided regarding the importance of this vaccine. Advised may receive this vaccine at local pharmacy or Health Dept. Aware to provide a copy of the vaccination record if obtained from local pharmacy or Health Dept. Verbalized acceptance and understanding.  Flu Vaccine status: Due, Education has been provided regarding the importance of this vaccine. Advised may receive this vaccine at local pharmacy or Health Dept. Aware to provide a copy of the vaccination record if obtained from local pharmacy or Health Dept. Verbalized acceptance and understanding.  Pneumococcal vaccine status: Up to date  Covid-19 vaccine status: Completed vaccines  Qualifies for Shingles Vaccine? Yes   Zostavax completed No   Shingrix Completed?: No.    Education has been provided regarding the importance of this vaccine. Patient has been advised to call insurance  company to determine out of pocket expense if they have not yet received this vaccine. Advised may also receive vaccine at local pharmacy or Health Dept. Verbalized acceptance and understanding.  Screening  Tests Health Maintenance  Topic Date Due   COVID-19 Vaccine (1) Never done   Zoster Vaccines- Shingrix (1 of 2) Never done   FOOT EXAM  11/09/2020   HEMOGLOBIN A1C  06/06/2022   Pneumonia Vaccine 43+ Years old (2 - PCV) 12/07/2022 (Originally 06/25/2008)   TETANUS/TDAP  12/07/2022 (Originally 07/20/2019)   OPHTHALMOLOGY EXAM  09/29/2022   URINE MICROALBUMIN  12/07/2022   DEXA SCAN  Completed   HPV VACCINES  Aged Out   INFLUENZA VACCINE  Discontinued    Health Maintenance  Health Maintenance Due  Topic Date Due   COVID-19 Vaccine (1) Never done   Zoster Vaccines- Shingrix (1 of 2) Never done   FOOT EXAM  11/09/2020   HEMOGLOBIN A1C  06/06/2022    Colorectal cancer screening: No longer required.   Mammogram status: No longer required due to age.  Bone Density status: Completed 05/26/2022. Results reflect: Bone density results: OSTEOPENIA. Repeat every 5 years.  Lung Cancer Screening: (Low Dose CT Chest recommended if Age 25-80 years, 30 pack-year currently smoking OR have quit w/in 15years.) does not qualify.   Lung Cancer Screening Referral: n/a  Additional Screening:  Hepatitis C Screening: does not qualify;   Vision Screening: Recommended annual ophthalmology exams for early detection of glaucoma and other disorders of the eye. Is the patient up to date with their annual eye exam?  Yes  Who is the provider or what is the name of the office in which the patient attends annual eye exams? Dr.Hecker  If pt is not established with a provider, would they like to be referred to a provider to establish care? No .   Dental Screening: Recommended annual dental exams for proper oral hygiene  Community Resource Referral / Chronic Care Management: CRR required this visit?  No   CCM required this visit?  No      Plan:     I have personally reviewed and noted the following in the patient's chart:   Medical and social history Use of alcohol, tobacco or illicit drugs   Current medications and supplements including opioid prescriptions. Patient is not currently taking opioid prescriptions. Functional ability and status Nutritional status Physical activity Advanced directives List of other physicians Hospitalizations, surgeries, and ER visits in previous 12 months Vitals Screenings to include cognitive, depression, and falls Referrals and appointments  In addition, I have reviewed and discussed with patient certain preventive protocols, quality metrics, and best practice recommendations. A written personalized care plan for preventive services as well as general preventive health recommendations were provided to patient.     Daphane Shepherd, LPN   1/95/0932   Nurse Notes: Due flu shot in the fall

## 2022-07-19 NOTE — Progress Notes (Unsigned)
   Established Patient Office Visit  Subjective   Patient ID: Kayla Alvarez, female    DOB: 1934/08/29  Age: 88 y.o. MRN: [id number]  No chief complaint on file.   HPI  Kayla Alvarez is here to follow-up on chronic disease management.   {History (Optional):23778}  ROS    Objective:     There were no vitals taken for this visit. {Vitals History (Optional):23777}  Physical Exam   No results found for any visits on 07/20/22.  {Labs (Optional):23779}  The ASCVD Risk score (Arnett DK, et al., 2019) failed to calculate for the following reasons:   The 2019 ASCVD risk score is only valid for ages 35 to 52    Assessment & Plan:   Problem List Items Addressed This Visit   None   No follow-ups on file.    Charyl Dancer, NP

## 2022-07-20 ENCOUNTER — Encounter: Payer: Self-pay | Admitting: Nurse Practitioner

## 2022-07-20 ENCOUNTER — Ambulatory Visit (INDEPENDENT_AMBULATORY_CARE_PROVIDER_SITE_OTHER): Payer: Medicare HMO | Admitting: Nurse Practitioner

## 2022-07-20 VITALS — BP 132/70 | HR 78 | Temp 97.2°F | Wt 156.8 lb

## 2022-07-20 DIAGNOSIS — E785 Hyperlipidemia, unspecified: Secondary | ICD-10-CM

## 2022-07-20 DIAGNOSIS — I10 Essential (primary) hypertension: Secondary | ICD-10-CM

## 2022-07-20 DIAGNOSIS — E119 Type 2 diabetes mellitus without complications: Secondary | ICD-10-CM | POA: Diagnosis not present

## 2022-07-20 DIAGNOSIS — E559 Vitamin D deficiency, unspecified: Secondary | ICD-10-CM

## 2022-07-20 DIAGNOSIS — E538 Deficiency of other specified B group vitamins: Secondary | ICD-10-CM

## 2022-07-20 LAB — COMPREHENSIVE METABOLIC PANEL
ALT: 17 U/L (ref 0–35)
AST: 15 U/L (ref 0–37)
Albumin: 3.7 g/dL (ref 3.5–5.2)
Alkaline Phosphatase: 57 U/L (ref 39–117)
BUN: 11 mg/dL (ref 6–23)
CO2: 30 mEq/L (ref 19–32)
Calcium: 9.4 mg/dL (ref 8.4–10.5)
Chloride: 103 mEq/L (ref 96–112)
Creatinine, Ser: 0.87 mg/dL (ref 0.40–1.20)
GFR: 60.6 mL/min (ref >60.00)
Glucose, Bld: 144 mg/dL — ABNORMAL HIGH (ref 70–99)
Potassium: 4.2 mEq/L (ref 3.5–5.1)
Sodium: 138 mEq/L (ref 135–145)
Total Bilirubin: 0.4 mg/dL (ref 0.2–1.2)
Total Protein: 6.1 g/dL (ref 6.0–8.3)

## 2022-07-20 LAB — LIPID PANEL
Cholesterol: 158 mg/dL (ref 0–200)
HDL: 51.1 mg/dL (ref >39.00)
LDL Cholesterol: 87 mg/dL (ref 0–99)
NonHDL: 106.46
Total CHOL/HDL Ratio: 3
Triglycerides: 95 mg/dL (ref 0.0–149.0)
VLDL: 19 mg/dL (ref 0.0–40.0)

## 2022-07-20 LAB — CBC
HCT: 43.5 % (ref 36.0–46.0)
Hemoglobin: 14.4 g/dL (ref 12.0–15.0)
MCHC: 33.1 g/dL (ref 30.0–36.0)
MCV: 91.4 fl (ref 78.0–100.0)
Platelets: 215 10*3/uL (ref 150.0–400.0)
RBC: 4.76 Mil/uL (ref 3.87–5.11)
RDW: 13.8 % (ref 11.5–15.5)
WBC: 5.9 10*3/uL (ref 4.0–10.5)

## 2022-07-20 MED ORDER — SIMVASTATIN 20 MG PO TABS: 20.0000 mg | ORAL_TABLET | Freq: Every day | ORAL | 1 refills | Status: DC

## 2022-07-20 NOTE — Patient Instructions (Signed)
It was great to see you!  Keep taking your medications, no changes are being made.   Let's follow-up in 3 months, sooner if you have concerns.  If a referral was placed today, you will be contacted for an appointment. Please note that routine referrals can sometimes take up to 3-4 weeks to process. Please call our office if you haven't heard anything after this time frame.  Take care,  Vance Peper, NP

## 2022-07-20 NOTE — Assessment & Plan Note (Signed)
Chronic, stable.  Home BP cuff verified against ours.  Continue furosemide 20 mg daily.  Follow-up in 3 months.

## 2022-07-20 NOTE — Assessment & Plan Note (Signed)
Last A1c was well controlled with diet.  We will check A1c today.

## 2022-07-20 NOTE — Assessment & Plan Note (Signed)
Chronic, stable.  Continue simvastatin 20 mg daily.  Check CMP, CBC, lipid panel today.

## 2022-07-20 NOTE — Assessment & Plan Note (Signed)
She has been taking a vitamin B12 supplement daily and states that the tingling in her arm has resolved.  We will check B12 levels today and treat based on results.

## 2022-07-20 NOTE — Assessment & Plan Note (Signed)
She has been taking a vitamin D supplement, will check levels today and treat based on result

## 2022-07-21 LAB — HEMOGLOBIN A1C
Hgb A1c MFr Bld: 6.4 % of total Hgb — ABNORMAL HIGH (ref <5.7)
Mean Plasma Glucose: 137 mg/dL
eAG (mmol/L): 7.6 mmol/L

## 2022-07-21 LAB — VITAMIN B12: Vitamin B-12: >1500 pg/mL — ABNORMAL HIGH (ref 211–911)

## 2022-07-21 LAB — VITAMIN D 25 HYDROXY (VIT D DEFICIENCY, FRACTURES): VITD: 21.85 ng/mL — ABNORMAL LOW (ref 30.00–100.00)

## 2022-07-21 NOTE — Progress Notes (Signed)
Called and informed patient of results and provider instructions. Patient voiced understanding. Sw, cma

## 2022-08-10 DIAGNOSIS — H21231 Degeneration of iris (pigmentary), right eye: Secondary | ICD-10-CM | POA: Diagnosis not present

## 2022-08-10 DIAGNOSIS — H353131 Nonexudative age-related macular degeneration, bilateral, early dry stage: Secondary | ICD-10-CM | POA: Diagnosis not present

## 2022-08-10 DIAGNOSIS — H40053 Ocular hypertension, bilateral: Secondary | ICD-10-CM | POA: Diagnosis not present

## 2022-08-10 DIAGNOSIS — H524 Presbyopia: Secondary | ICD-10-CM | POA: Diagnosis not present

## 2022-08-10 DIAGNOSIS — H402231 Chronic angle-closure glaucoma, bilateral, mild stage: Secondary | ICD-10-CM | POA: Diagnosis not present

## 2022-08-10 LAB — HM DIABETES EYE EXAM

## 2022-08-14 ENCOUNTER — Encounter: Payer: Self-pay | Admitting: Nurse Practitioner

## 2022-12-07 ENCOUNTER — Other Ambulatory Visit: Payer: Self-pay | Admitting: Nurse Practitioner

## 2022-12-07 DIAGNOSIS — E785 Hyperlipidemia, unspecified: Secondary | ICD-10-CM

## 2022-12-07 MED ORDER — SIMVASTATIN 20 MG PO TABS: 20.0000 mg | ORAL_TABLET | Freq: Every day | ORAL | 1 refills | Status: DC

## 2023-02-04 NOTE — Progress Notes (Unsigned)
   Established Patient Office Visit  Subjective   Patient ID: Kayla Alvarez, female    DOB: 04/28/1934  Age: 89 y.o. MRN: [id number]  No chief complaint on file.   HPI  Kayla Alvarez is here to follow-up on hypertension, hyperlipidemia and diabetes. Her diabetes is well controlled with diet.   HYPERTENSION / HYPERLIPIDEMIA  Satisfied with current treatment? {Blank single:19197::"yes","no"} Duration of hypertension: {Blank single:19197::"chronic","months","years"} BP monitoring frequency: {Blank single:19197::"not checking","rarely","daily","weekly","monthly","a few times a day","a few times a week","a few times a month"} BP range:  BP medication side effects: {Blank single:19197::"yes","no"} Past BP meds: furosemide Duration of hyperlipidemia: {Blank single:19197::"chronic","months","years"} Cholesterol medication side effects: {Blank single:19197::"yes","no"} Cholesterol supplements: {Blank multiple:19196::"none","fish oil","niacin","red yeast rice"} Past cholesterol medications: zocor Medication compliance: {Blank single:19197::"excellent compliance","good compliance","fair compliance","poor compliance"} Aspirin: {Blank single:19197::"yes","no"} Recent stressors: {Blank single:19197::"yes","no"} Recurrent headaches: {Blank single:19197::"yes","no"} Visual changes: {Blank single:19197::"yes","no"} Palpitations: {Blank single:19197::"yes","no"} Dyspnea: {Blank single:19197::"yes","no"} Chest pain: {Blank single:19197::"yes","no"} Lower extremity edema: {Blank single:19197::"yes","no"} Dizzy/lightheaded: {Blank single:19197::"yes","no"}   {History (Optional):23778}  ROS    Objective:     There were no vitals taken for this visit. {Vitals History (Optional):23777}  Physical Exam   No results found for any visits on 02/05/23.  {Labs (Optional):23779}  The ASCVD Risk score (Arnett DK, et al., 2019) failed to calculate for the following reasons:   The 2019 ASCVD risk  score is only valid for ages 77 to 47    Assessment & Plan:   Problem List Items Addressed This Visit   None   No follow-ups on file.    Gerre Scull, NP

## 2023-02-05 ENCOUNTER — Encounter: Payer: Self-pay | Admitting: Nurse Practitioner

## 2023-02-05 ENCOUNTER — Ambulatory Visit (INDEPENDENT_AMBULATORY_CARE_PROVIDER_SITE_OTHER): Payer: Medicare HMO | Admitting: Nurse Practitioner

## 2023-02-05 VITALS — BP 132/70 | HR 68 | Temp 97.9°F | Ht 60.0 in | Wt 157.6 lb

## 2023-02-05 DIAGNOSIS — E785 Hyperlipidemia, unspecified: Secondary | ICD-10-CM | POA: Diagnosis not present

## 2023-02-05 DIAGNOSIS — K529 Noninfective gastroenteritis and colitis, unspecified: Secondary | ICD-10-CM

## 2023-02-05 DIAGNOSIS — E119 Type 2 diabetes mellitus without complications: Secondary | ICD-10-CM

## 2023-02-05 DIAGNOSIS — I10 Essential (primary) hypertension: Secondary | ICD-10-CM

## 2023-02-05 MED ORDER — OMEPRAZOLE 40 MG PO CPDR: 40.0000 mg | DELAYED_RELEASE_CAPSULE | Freq: Every day | ORAL | 0 refills | Status: DC

## 2023-02-05 NOTE — Assessment & Plan Note (Signed)
Chronic, stable.  Continue simvastatin 20 mg daily.  Check CMP, CBC, lipid panel today. Follow-up in 6 months.

## 2023-02-05 NOTE — Assessment & Plan Note (Signed)
Last A1c was well controlled with diet.  We will check A1c today. 

## 2023-02-05 NOTE — Assessment & Plan Note (Signed)
Chronic, stable.  Continue furosemide 20 mg daily. Check CMP, CBC today. Follow-up in 6 months.

## 2023-02-05 NOTE — Patient Instructions (Addendum)
It was great to see you!  Start omeprazole once a day for 2 weeks  We are checking your labs today and will let you know the results via mychart/phone.   Let's follow-up in 6 months, sooner if you have concerns.  If a referral was placed today, you will be contacted for an appointment. Please note that routine referrals can sometimes take up to 3-4 weeks to process. Please call our office if you haven't heard anything after this time frame.  Take care,  Rodman Pickle, NP

## 2023-02-06 ENCOUNTER — Telehealth: Payer: Self-pay | Admitting: Nurse Practitioner

## 2023-02-06 LAB — COMPREHENSIVE METABOLIC PANEL
ALT: 16 U/L (ref 0–35)
AST: 16 U/L (ref 0–37)
Albumin: 3.7 g/dL (ref 3.5–5.2)
Alkaline Phosphatase: 43 U/L (ref 39–117)
BUN: 7 mg/dL (ref 6–23)
CO2: 28 mEq/L (ref 19–32)
Calcium: 9.1 mg/dL (ref 8.4–10.5)
Chloride: 104 mEq/L (ref 96–112)
Creatinine, Ser: 0.77 mg/dL (ref 0.40–1.20)
GFR: 69.89 mL/min (ref >60.00)
Glucose, Bld: 96 mg/dL (ref 70–99)
Potassium: 3.9 mEq/L (ref 3.5–5.1)
Sodium: 140 mEq/L (ref 135–145)
Total Bilirubin: 0.5 mg/dL (ref 0.2–1.2)
Total Protein: 5.8 g/dL — ABNORMAL LOW (ref 6.0–8.3)

## 2023-02-06 LAB — CBC
HCT: 39.6 % (ref 36.0–46.0)
Hemoglobin: 13.3 g/dL (ref 12.0–15.0)
MCHC: 33.6 g/dL (ref 30.0–36.0)
MCV: 89.4 fl (ref 78.0–100.0)
Platelets: 269 10*3/uL (ref 150.0–400.0)
RBC: 4.43 Mil/uL (ref 3.87–5.11)
RDW: 13.1 % (ref 11.5–15.5)
WBC: 7.2 10*3/uL (ref 4.0–10.5)

## 2023-02-06 LAB — HEMOGLOBIN A1C: Hgb A1c MFr Bld: 6.8 % — ABNORMAL HIGH (ref 4.6–6.5)

## 2023-02-06 NOTE — Telephone Encounter (Signed)
Pt returning call. Call back [phone]

## 2023-02-06 NOTE — Telephone Encounter (Signed)
I returned patient's call and notified her of recent lab results.

## 2023-02-14 ENCOUNTER — Encounter: Payer: Self-pay | Admitting: *Deleted

## 2023-03-29 DIAGNOSIS — Z85828 Personal history of other malignant neoplasm of skin: Secondary | ICD-10-CM | POA: Diagnosis not present

## 2023-03-29 DIAGNOSIS — L718 Other rosacea: Secondary | ICD-10-CM | POA: Diagnosis not present

## 2023-03-29 DIAGNOSIS — D1721 Benign lipomatous neoplasm of skin and subcutaneous tissue of right arm: Secondary | ICD-10-CM | POA: Diagnosis not present

## 2023-03-29 DIAGNOSIS — L550 Sunburn of first degree: Secondary | ICD-10-CM | POA: Diagnosis not present

## 2023-03-29 DIAGNOSIS — D1801 Hemangioma of skin and subcutaneous tissue: Secondary | ICD-10-CM | POA: Diagnosis not present

## 2023-03-29 DIAGNOSIS — L814 Other melanin hyperpigmentation: Secondary | ICD-10-CM | POA: Diagnosis not present

## 2023-03-29 DIAGNOSIS — L570 Actinic keratosis: Secondary | ICD-10-CM | POA: Diagnosis not present

## 2023-03-29 DIAGNOSIS — D225 Melanocytic nevi of trunk: Secondary | ICD-10-CM | POA: Diagnosis not present

## 2023-03-29 DIAGNOSIS — L821 Other seborrheic keratosis: Secondary | ICD-10-CM | POA: Diagnosis not present

## 2023-04-23 ENCOUNTER — Other Ambulatory Visit: Payer: Self-pay | Admitting: Nurse Practitioner

## 2023-04-23 DIAGNOSIS — Z1231 Encounter for screening mammogram for malignant neoplasm of breast: Secondary | ICD-10-CM

## 2023-05-13 DIAGNOSIS — J40 Bronchitis, not specified as acute or chronic: Secondary | ICD-10-CM | POA: Diagnosis not present

## 2023-05-14 ENCOUNTER — Telehealth: Payer: Self-pay

## 2023-05-14 NOTE — Telephone Encounter (Signed)
Left patient a detailed voice message to return call to office for scheduling AWV after 07/12/2023.

## 2023-05-28 ENCOUNTER — Ambulatory Visit: Admission: RE | Admit: 2023-05-28 | Payer: Medicare HMO | Source: Ambulatory Visit

## 2023-05-28 DIAGNOSIS — Z1231 Encounter for screening mammogram for malignant neoplasm of breast: Secondary | ICD-10-CM

## 2023-05-29 ENCOUNTER — Other Ambulatory Visit: Payer: Self-pay

## 2023-05-29 MED ORDER — SIMVASTATIN 20 MG PO TABS: 20.0000 mg | ORAL_TABLET | Freq: Every day | ORAL | 1 refills | Status: DC

## 2023-05-29 NOTE — Telephone Encounter (Signed)
Requesting: Simvastatin 20mg  Last Visit: 02/05/2023 Next Visit: Visit date not found Last Refill: 12/07/2022  Please Advise

## 2023-06-11 DIAGNOSIS — H401134 Primary open-angle glaucoma, bilateral, indeterminate stage: Secondary | ICD-10-CM | POA: Diagnosis not present

## 2023-06-11 DIAGNOSIS — Z961 Presence of intraocular lens: Secondary | ICD-10-CM | POA: Diagnosis not present

## 2023-06-11 DIAGNOSIS — H35363 Drusen (degenerative) of macula, bilateral: Secondary | ICD-10-CM | POA: Diagnosis not present

## 2023-06-11 DIAGNOSIS — H43393 Other vitreous opacities, bilateral: Secondary | ICD-10-CM | POA: Diagnosis not present

## 2023-06-28 ENCOUNTER — Encounter: Payer: Self-pay | Admitting: Nurse Practitioner

## 2023-06-28 ENCOUNTER — Ambulatory Visit (INDEPENDENT_AMBULATORY_CARE_PROVIDER_SITE_OTHER)
Admission: RE | Admit: 2023-06-28 | Discharge: 2023-06-28 | Disposition: A | Discharge status: Home / Self Care | Payer: Medicare HMO | Source: Ambulatory Visit | Attending: Nurse Practitioner | Admitting: Nurse Practitioner

## 2023-06-28 ENCOUNTER — Ambulatory Visit (INDEPENDENT_AMBULATORY_CARE_PROVIDER_SITE_OTHER): Payer: Medicare HMO | Admitting: Nurse Practitioner

## 2023-06-28 VITALS — BP 138/72 | HR 68 | Temp 97.5°F | Ht 60.0 in | Wt 153.4 lb

## 2023-06-28 DIAGNOSIS — R059 Cough, unspecified: Secondary | ICD-10-CM | POA: Diagnosis not present

## 2023-06-28 DIAGNOSIS — R0602 Shortness of breath: Secondary | ICD-10-CM | POA: Diagnosis not present

## 2023-06-28 DIAGNOSIS — R051 Acute cough: Secondary | ICD-10-CM | POA: Diagnosis not present

## 2023-06-28 DIAGNOSIS — R918 Other nonspecific abnormal finding of lung field: Secondary | ICD-10-CM | POA: Diagnosis not present

## 2023-06-28 DIAGNOSIS — I700 Atherosclerosis of aorta: Secondary | ICD-10-CM | POA: Diagnosis not present

## 2023-06-28 MED ORDER — ARNUITY ELLIPTA 100 MCG/ACT IN AEPB: 1.0000 | INHALATION_SPRAY | Freq: Every day | RESPIRATORY_TRACT | 1 refills | Status: DC

## 2023-06-28 NOTE — Assessment & Plan Note (Signed)
Patient has a lingering, dry cough for over a month. She has taken antibiotic, prednisone and a prescribed cough syrup with codeine  from a urgent care visit which was not that effective with managing the cough. Encouraged patient to continue to drink hot tea and drink plenty of water to help suppress the cough. Will prescribe fluticasone furoate inhaler once daily to help with the cough. Ordered 2 view chest xray. Will follow-up in 4 weeks.

## 2023-06-28 NOTE — Patient Instructions (Signed)
It was great to see you!  We are going to get a chest xray  Start arnuity inhaler 1 puff daily. Rinse your mouth out after using  Continue to use your albuterol inhaler as needed  Let's follow-up in 4 weeks, sooner if you have concerns.  If a referral was placed today, you will be contacted for an appointment. Please note that routine referrals can sometimes take up to 3-4 weeks to process. Please call our office if you haven't heard anything after this time frame.  Take care,  Rodman Pickle, NP

## 2023-06-28 NOTE — Progress Notes (Signed)
Established Patient Office Visit  Subjective   Patient ID: AMIAYA ARVIZU, female    DOB: 07-Jul-1934  Age: 89 y.o. MRN: [id number]  Chief Complaint  Patient presents with   Cough    Ongoing since July 2024   HPI: Patient presents with cough that has been ongoing since July 2024. She states her family and her got a cold at the beginning of July  and everyone seems to be better except her. She states this cough will not go away. She originally went to Urgent care when she was sick  and was prescribed prednisone , antibiotic, and cough syrup with codeine that  she felt was not really effective. She did complete all the prednisone and antibiotic that was prescribed. She states since she continues to have this nagging cough she started taking over the counter cough syrup-Robitussin with honey and Tylenol cold/cough which did help some with the cough. She has taken cough  lozenges, warm tea, and even warm salt gargles to help. She describes the cough as dry, and when the cough starts it seem to last for long period of time causing her to have difficulties catching her breathe during these coughing spells (shortness of breath). She shares she has had to sleep sitting up in a chair at times. The cough wakes her up at night and she feels extremely embarrassed when she is in public and these coughing spells start. She denies any fever, sore throat. She does admit to feeling like she was having something in her throat when she swallowed that would exacerbated the cough however that sensation has resolved.   Review of Systems  Constitutional:  Negative for chills and fever.  HENT:  Negative for congestion and sore throat.   Respiratory:  Positive for cough, shortness of breath and wheezing.   Cardiovascular:  Negative for chest pain and palpitations.  Psychiatric/Behavioral: Negative.        Objective:     BP 138/72 (BP Location: Left Arm)   Pulse 68   Temp (!) 97.5 F (36.4 C)   Ht 5' (1.524 m)    Wt 153 lb 6.4 oz (69.6 kg)   SpO2 97%   BMI 29.96 kg/m    Physical Exam Constitutional:      General: She is not in acute distress.    Appearance: Normal appearance.  HENT:     Head: Normocephalic and atraumatic.     Right Ear: Tympanic membrane, ear canal and external ear normal.     Left Ear: Tympanic membrane, ear canal and external ear normal.     Mouth/Throat:     Mouth: Mucous membranes are moist.     Pharynx: No oropharyngeal exudate or posterior oropharyngeal erythema.  Cardiovascular:     Rate and Rhythm: Normal rate and regular rhythm.     Pulses: Normal pulses.     Heart sounds: Normal heart sounds. No murmur heard.    No friction rub. No gallop.  Pulmonary:     Effort: Pulmonary effort is normal. No respiratory distress.     Breath sounds: No stridor. Wheezing present. No rhonchi or rales.  Neurological:     Mental Status: She is alert and oriented to person, place, and time.  Psychiatric:        Mood and Affect: Mood normal.        Behavior: Behavior normal.        Thought Content: Thought content normal.        Judgment: Judgment normal.  No results found for any visits on 06/28/23.    The ASCVD Risk score (Arnett DK, et al., 2019) failed to calculate for the following reasons:   The 2019 ASCVD risk score is only valid for ages 52 to 36    Assessment & Plan:   Problem List Items Addressed This Visit       Other   Acute cough - Primary    Patient has a lingering, dry cough for over a month. She has taken antibiotic, prednisone and a prescribed cough syrup with codeine  from a urgent care visit which was not that effective with managing the cough. Encouraged patient to continue to drink hot tea and drink plenty of water to help suppress the cough. Will prescribe fluticasone furoate inhaler once daily to help with the cough. Ordered 2 view chest xray. Will follow-up in 4 weeks.           Relevant Orders   DG Chest 2 View    Return in about 4  weeks (around 07/26/2023) for cough.    Bishop Dublin, RN

## 2023-07-06 ENCOUNTER — Other Ambulatory Visit: Payer: Self-pay | Admitting: Nurse Practitioner

## 2023-07-06 ENCOUNTER — Telehealth: Payer: Self-pay

## 2023-07-06 DIAGNOSIS — R9389 Abnormal findings on diagnostic imaging of other specified body structures: Secondary | ICD-10-CM

## 2023-07-06 NOTE — Telephone Encounter (Signed)
CXR Imaging Report routed to PCP.

## 2023-07-06 NOTE — Telephone Encounter (Signed)
Already noted and called patient in result section.

## 2023-07-12 ENCOUNTER — Encounter: Payer: Self-pay | Admitting: Nurse Practitioner

## 2023-07-23 ENCOUNTER — Ambulatory Visit
Admission: RE | Admit: 2023-07-23 | Discharge: 2023-07-23 | Disposition: A | Discharge status: Home / Self Care | Payer: Medicare HMO | Source: Ambulatory Visit | Attending: Nurse Practitioner | Admitting: Nurse Practitioner

## 2023-07-23 DIAGNOSIS — R9389 Abnormal findings on diagnostic imaging of other specified body structures: Secondary | ICD-10-CM

## 2023-07-23 DIAGNOSIS — I2510 Atherosclerotic heart disease of native coronary artery without angina pectoris: Secondary | ICD-10-CM | POA: Diagnosis not present

## 2023-07-23 DIAGNOSIS — R911 Solitary pulmonary nodule: Secondary | ICD-10-CM | POA: Diagnosis not present

## 2023-07-24 ENCOUNTER — Telehealth: Payer: Self-pay

## 2023-07-24 NOTE — Telephone Encounter (Signed)
Patient called and questions if she should continue to use the Fluticasone Furoate inhaler that was prescribed 06/28/23 visit

## 2023-07-24 NOTE — Telephone Encounter (Signed)
Pt reports pharmacy notified her of the refill on her inhaler, but she is not longer coughing. I informed her if the inhaler was prescribed for an acute cough and she feels like she no longer needs it, she can defer to pick it up. If the cough returns, reach out and notify Lauren.

## 2023-07-30 ENCOUNTER — Telehealth: Payer: Self-pay | Admitting: Nurse Practitioner

## 2023-07-30 ENCOUNTER — Encounter: Payer: Self-pay | Admitting: Nurse Practitioner

## 2023-07-30 ENCOUNTER — Telehealth: Payer: Self-pay | Admitting: *Deleted

## 2023-07-30 ENCOUNTER — Ambulatory Visit (INDEPENDENT_AMBULATORY_CARE_PROVIDER_SITE_OTHER): Payer: Medicare HMO

## 2023-07-30 ENCOUNTER — Ambulatory Visit (INDEPENDENT_AMBULATORY_CARE_PROVIDER_SITE_OTHER): Payer: Medicare HMO | Admitting: Nurse Practitioner

## 2023-07-30 VITALS — BP 128/74 | HR 64 | Temp 97.1°F | Ht 60.0 in | Wt 157.0 lb

## 2023-07-30 DIAGNOSIS — R051 Acute cough: Secondary | ICD-10-CM | POA: Diagnosis not present

## 2023-07-30 DIAGNOSIS — Z0000 Encounter for general adult medical examination without abnormal findings: Secondary | ICD-10-CM | POA: Diagnosis not present

## 2023-07-30 DIAGNOSIS — I10 Essential (primary) hypertension: Secondary | ICD-10-CM

## 2023-07-30 MED ORDER — FUROSEMIDE 20 MG PO TABS: 20.0000 mg | ORAL_TABLET | Freq: Every day | ORAL | 1 refills | Status: DC

## 2023-07-30 MED ORDER — MIRABEGRON ER 25 MG PO TB24: 25.0000 mg | ORAL_TABLET | Freq: Every day | ORAL | 1 refills | Status: AC

## 2023-07-30 NOTE — Progress Notes (Unsigned)
Care Coordination  Outreach Note  07/30/2023 Name: Kayla Alvarez MRN: [id number] DOB: 02-Sep-1934   Care Coordination Outreach Attempts: An unsuccessful telephone outreach was attempted today to offer the patient information about available care coordination services.  Follow Up Plan:  Additional outreach attempts will be made to offer the patient care coordination information and services.   Encounter Outcome:  No Answer  Burman Nieves, CCMA Care Coordination Care Guide Direct Dial: [phone]

## 2023-07-30 NOTE — Telephone Encounter (Signed)
I returned patient's call and documented under imaging.

## 2023-07-30 NOTE — Progress Notes (Signed)
Subjective:   Kayla Alvarez is a 89 y.o. female who presents for Medicare Annual (Subsequent) preventive examination.  Visit Complete: In person    Cardiac Risk Factors include: advanced age (>83men, >25 women);diabetes mellitus;dyslipidemia;hypertension;obesity (BMI >30kg/m2)     Objective:    Today's Vitals   07/30/23 1326  BP: 128/74  Pulse: 64  Temp: (!) 97.1 F (36.2 C)  TempSrc: Oral  SpO2: 98%  Weight: 157 lb (71.2 kg)  Height: 5' (1.524 m)   Body mass index is 30.66 kg/m.     07/30/2023    1:36 PM 07/11/2022    1:31 PM 06/23/2021   10:56 AM 05/05/2020    1:05 PM 03/07/2020   12:41 PM  Advanced Directives  Does Patient Have a Medical Advance Directive? No No No No No  Would patient like information on creating a medical advance directive? No - Patient declined No - Patient declined No - Patient declined No - Patient declined No - Patient declined    Current Medications (verified) Outpatient Encounter Medications as of 07/30/2023  Medication Sig   albuterol (VENTOLIN HFA) 108 (90 Base) MCG/ACT inhaler Inhale 2 puffs into the lungs every 6 (six) hours as needed.   Cholecalciferol (VITAMIN D3 PO) Take by mouth daily.   ELDERBERRY PO Take by mouth.   Fluticasone Furoate (ARNUITY ELLIPTA) 100 MCG/ACT AEPB Inhale 1 puff into the lungs daily at 6 (six) AM.   furosemide (LASIX) 20 MG tablet Take 1 tablet (20 mg total) by mouth daily.   latanoprost (XALATAN) 0.005 % ophthalmic solution SMARTSIG:1 Drop(s) In Eye(s) Every Evening   mirabegron ER (MYRBETRIQ) 25 MG TB24 tablet Take 1 tablet (25 mg total) by mouth daily.   simvastatin (ZOCOR) 20 MG tablet Take 1 tablet (20 mg total) by mouth at bedtime.   vitamin B-12 (CYANOCOBALAMIN) 1000 MCG tablet Take 1,000 mcg by mouth daily.   [DISCONTINUED] simvastatin (ZOCOR) 40 MG tablet Take 1 tablet (40 mg total) by mouth at bedtime. (Patient taking differently: Take 20 mg by mouth at bedtime.)   No facility-administered encounter  medications on file as of 07/30/2023.    Allergies (verified) Patient has no known allergies.   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   UTI 09/03/2008   VAGINITIS, ATROPHIC 06/26/2007   Venous insufficiency    Past Surgical History:  Procedure Laterality Date   CESAREAN SECTION     EYE SURGERY     HEMORRHOID SURGERY     TOOTH EXTRACTION     Family History  Problem Relation Age of Onset   Hypertension Mother    Stroke Mother    Heart disease Father    Hypertension Father    Breast cancer Sister        unsure of age   Hypertension Sister    Social History   Socioeconomic History   Marital status: Widowed    Spouse name: Not on file   Number of children: Not on file   Years of education: Not on file   Highest education level: Not on file  Occupational History   Not on file  Tobacco Use  Smoking status: Former   Smokeless tobacco: Never  Vaping Use   Vaping status: Never Used  Substance and Sexual Activity   Alcohol use: No   Drug use: No   Sexual activity: Not on file  Other Topics Concern   Not on file  Social History Narrative   Not on file   Social Determinants of Health   Financial Resource Strain: Low Risk  (07/30/2023)   Overall Financial Resource Strain (CARDIA)    Difficulty of Paying Living Expenses: Not hard at all  Food Insecurity: No Food Insecurity (07/30/2023)   Hunger Vital Sign    Worried About Running Out of Food in the Last Year: Never true    Ran Out of Food in the Last Year: Never true  Transportation Needs: No Transportation Needs (07/30/2023)   PRAPARE - Doctor, general practice (Medical): No    Lack of Transportation (Non-Medical): No  Physical Activity: Inactive (07/30/2023)   Exercise Vital Sign    Days of Exercise per Week: 0 days    Minutes of Exercise per Session: 0 min  Stress: No Stress Concern Present (07/30/2023)   [employer] of Occupational Health - Occupational Stress Questionnaire    Feeling of Stress : Not at all  Social Connections: Moderately Isolated (07/30/2023)   Social Connection and Isolation Panel [NHANES]    Frequency of Communication with Friends and Family: Three times a week    Frequency of Social Gatherings with Friends and Family: More than three times a week    Attends Religious Services: More than 4 times per year    Active Member of Clubs or Organizations: No    Attends Banker Meetings: Never    Marital Status: Widowed    Tobacco Counseling Counseling given: Not Answered   Clinical Intake:  Pre-visit preparation completed: Yes  Pain : No/denies pain     Nutritional Status: BMI > 30  Obese Nutritional Risks: None Diabetes: Yes CBG done?: No Did pt. bring in CBG monitor from home?: No  How often do you need to have someone help you when you read instructions, pamphlets, or other written materials from your doctor or pharmacy?: 1 - Never  Interpreter Needed?: No  Information entered by :: NAllen LPN   Activities of Daily Living    07/30/2023    1:27 PM  In your present state of health, do you have any difficulty performing the following activities:  Hearing? 1  Comment had hearing aids broke  Vision? 0  Difficulty concentrating or making decisions? 0  Walking or climbing stairs? 0  Dressing or bathing? 0  Doing errands, shopping? 0  Preparing Food and eating ? N  Using the Toilet? N  In the past six months, have you accidently leaked urine? N  Do you have problems with loss of bowel control? N  Managing your Medications? N  Managing your Finances? N  Housekeeping or  managing your Housekeeping? N    Patient Care Team: Gerre Scull, NP as PCP - General (Internal Medicine) Mardella Layman, MD as Consulting Physician (Gastroenterology) Venancio Poisson, MD as Consulting Physician (Dermatology) Mateo Flow, MD as Consulting Physician (Ophthalmology) Mateo Flow, MD as Consulting Physician (Ophthalmology) [facility]  Indicate any recent Medical Services you may have received from other than [facility] providers in the past year (date may be approximate).     Assessment:   This is a routine wellness examination for Ludwika.  Hearing/Vision screen Hearing  Screening - Comments:: Hearing aids broke, referral to amb Vision Screening - Comments:: Regular eye exams, [facility]   Goals Addressed             This Visit's Progress    Patient Stated       07/30/2023, stay healthy       Depression Screen    07/30/2023    1:38 PM 06/28/2023    9:35 AM 07/11/2022    1:30 PM 04/24/2022    2:32 PM 12/07/2021    3:10 PM 06/23/2021   11:11 AM 05/05/2020   12:50 PM  PHQ 2/9 Scores  PHQ - 2 Score 0 1 0 0 0 0 0  PHQ- 9 Score 0 2         Fall Risk    07/30/2023    1:37 PM 06/28/2023    9:30 AM 02/05/2023    3:10 PM 07/11/2022    1:29 PM 04/24/2022    2:32 PM  Fall Risk   Falls in the past year? 0 0 1 0 0  Number falls in past yr: 0 0 0 0 0  Injury with Fall? 0 0 0 0 0  Risk for fall due to : Medication side effect No Fall Risks No Fall Risks No Fall Risks   Follow up Falls prevention discussed;Falls evaluation completed Falls evaluation completed Falls evaluation completed Falls prevention discussed     MEDICARE RISK AT HOME: Medicare Risk at Home Any stairs in or around the home?: Yes If so, are there any without handrails?: No Home free of loose throw rugs in walkways, pet beds, electrical cords, etc?: Yes Adequate lighting in your home to reduce risk of falls?: Yes Life alert?: No Use of a cane, walker or w/c?: No Grab  bars in the bathroom?: No Shower chair or bench in shower?: No Elevated toilet seat or a handicapped toilet?: Yes  TIMED UP AND GO:  Was the test performed?  Yes  Length of time to ambulate 10 feet: 5 sec Gait slow and steady without use of assistive device    Cognitive Function:        07/30/2023    1:40 PM 07/11/2022    1:32 PM 05/05/2020   12:51 PM  6CIT Screen  What Year? 0 points 0 points 0 points  What month? 0 points 0 points 0 points  What time? 0 points 0 points 0 points  Count back from 20 0 points 0 points 0 points  Months in reverse 0 points 0 points 0 points  Repeat phrase 0 points 0 points 2 points  Total Score 0 points 0 points 2 points    Immunizations Immunization History  Administered Date(s) Administered   Influenza Whole 10/30/2004   Pneumococcal Polysaccharide-23 10/30/2001, 06/26/2007   Td 10/30/1998, 07/19/2009    TDAP status: Due, Education has been provided regarding the importance of this vaccine. Advised may receive this vaccine at local pharmacy or Health Dept. Aware to provide a copy of the vaccination record if obtained from local pharmacy or Health Dept. Verbalized acceptance and understanding.  Flu Vaccine status: Declined, Education has been provided regarding the importance of this vaccine but patient still declined. Advised may receive this vaccine at local pharmacy or Health Dept. Aware to provide a copy of the vaccination record if obtained from local pharmacy or Health Dept. Verbalized acceptance and understanding.  Pneumococcal vaccine status: Declined,  Education has been provided regarding the importance of this vaccine but patient still declined.  Advised may receive this vaccine at local pharmacy or Health Dept. Aware to provide a copy of the vaccination record if obtained from local pharmacy or Health Dept. Verbalized acceptance and understanding.   Covid-19 vaccine status: Declined, Education has been provided regarding the importance  of this vaccine but patient still declined. Advised may receive this vaccine at local pharmacy or Health Dept.or vaccine clinic. Aware to provide a copy of the vaccination record if obtained from local pharmacy or Health Dept. Verbalized acceptance and understanding.  Qualifies for Shingles Vaccine? Yes   Zostavax completed No   Shingrix Completed?: No.    Education has been provided regarding the importance of this vaccine. Patient has been advised to call insurance company to determine out of pocket expense if they have not yet received this vaccine. Advised may also receive vaccine at local pharmacy or Health Dept. Verbalized acceptance and understanding.  Screening Tests Health Maintenance  Topic Date Due   HEMOGLOBIN A1C  08/07/2023   OPHTHALMOLOGY EXAM  08/11/2023   FOOT EXAM  02/05/2024   Medicare Annual Wellness (AWV)  07/29/2024   DEXA SCAN  Completed   HPV VACCINES  Aged Out   DTaP/Tdap/Td  Discontinued   Pneumonia Vaccine 87+ Years old  Discontinued   INFLUENZA VACCINE  Discontinued   COVID-19 Vaccine  Discontinued   Zoster Vaccines- Shingrix  Discontinued    Health Maintenance  There are no preventive care reminders to display for this patient.   Colorectal cancer screening: No longer required.   Mammogram status: Completed 05/28/2023. Repeat every year  Bone Density status: Completed 05/26/2022.   Lung Cancer Screening: (Low Dose CT Chest recommended if Age 103-80 years, 20 pack-year currently smoking OR have quit w/in 15years.) does not qualify.   Lung Cancer Screening Referral: no  Additional Screening:  Hepatitis C Screening: does not qualify  Vision Screening: Recommended annual ophthalmology exams for early detection of glaucoma and other disorders of the eye. Is the patient up to date with their annual eye exam?  Yes  Who is the provider or what is the name of the office in which the patient attends annual eye exams? [facility] If pt is not established  with a provider, would they like to be referred to a provider to establish care? No .   Dental Screening: Recommended annual dental exams for proper oral hygiene  Diabetic Foot Exam: Diabetic Foot Exam: Completed 02/05/2023  Community Resource Referral / Chronic Care Management: CRR required this visit?  Yes   CCM required this visit?  No     Plan:     I have personally reviewed and noted the following in the patient's chart:   Medical and social history Use of alcohol, tobacco or illicit drugs  Current medications and supplements including opioid prescriptions. Patient is not currently taking opioid prescriptions. Functional ability and status Nutritional status Physical activity Advanced directives List of other physicians Hospitalizations, surgeries, and ER visits in previous 12 months Vitals Screenings to include cognitive, depression, and falls Referrals and appointments  In addition, I have reviewed and discussed with patient certain preventive protocols, quality metrics, and best practice recommendations. A written personalized care plan for preventive services as well as general preventive health recommendations were provided to patient.     Barb Merino, LPN   06/27/5620   After Visit Summary: (In Person-Printed) AVS printed and given to the patient  Nurse Notes: none

## 2023-07-30 NOTE — Assessment & Plan Note (Signed)
Cough has improved a few days after starting the arnuity elipta inhaler. Will have her stop this and reach out if her cough returns. Chest x-ray showed no acute disease, but a possible lung nodule. CT results are still pending.

## 2023-07-30 NOTE — Assessment & Plan Note (Signed)
Chronic, stable.  Continue furosemide 20 mg daily. Refill sent to the pharmacy. Follow-up in 3 months.

## 2023-07-30 NOTE — Patient Instructions (Signed)
It was great to see you!  Start the kegel exercises several times a day to help with your bladder and bowels  You can stop the arnuity elipta inhaler. Let me know if you start coughing again.   Let's follow-up in 3 months, sooner if you have concerns.  If a referral was placed today, you will be contacted for an appointment. Please note that routine referrals can sometimes take up to 3-4 weeks to process. Please call our office if you haven't heard anything after this time frame.  Take care,  Rodman Pickle, NP

## 2023-07-30 NOTE — Addendum Note (Signed)
Addended by: Barb Merino on: 07/30/2023 01:54 PM   Modules accepted: Orders

## 2023-07-30 NOTE — Progress Notes (Signed)
Established Patient Office Visit  Subjective   Patient ID: LISETH RYON, female    DOB: 1933-12-28  Age: 89 y.o. MRN: [id number]  Chief Complaint  Patient presents with   Acute Cough    Follow up    HPI  KENZI DAVIDOWITZ is here to follow-up on cough.   Last visit she was started on arnuity ellipta inhaler. She had a chest x-ray that was negative for acute problems, but did show a possible left lung nodule. She had a CT scan last week and the results are pending. She states that her cough got better after a few days on the inhaler. She has still been using the inhaler once a day and has about 7 doses left. She is wondering she needs to continue this.     ROS See pertinent positives and negatives per HPI.    Objective:     BP 128/74 (BP Location: Left Arm)   Pulse 64   Temp (!) 97.1 F (36.2 C)   Ht 5' (1.524 m)   Wt 157 lb (71.2 kg)   SpO2 98%   BMI 30.66 kg/m    Physical Exam Vitals and nursing note reviewed.  Constitutional:      General: She is not in acute distress.    Appearance: Normal appearance.  HENT:     Head: Normocephalic.  Eyes:     Conjunctiva/sclera: Conjunctivae normal.  Cardiovascular:     Rate and Rhythm: Normal rate and regular rhythm.     Pulses: Normal pulses.     Heart sounds: Normal heart sounds.  Pulmonary:     Effort: Pulmonary effort is normal.     Breath sounds: Normal breath sounds.  Musculoskeletal:     Cervical back: Normal range of motion.  Skin:    General: Skin is warm.  Neurological:     General: No focal deficit present.     Mental Status: She is alert and oriented to person, place, and time.  Psychiatric:        Mood and Affect: Mood normal.        Behavior: Behavior normal.        Thought Content: Thought content normal.        Judgment: Judgment normal.      Assessment & Plan:   Problem List Items Addressed This Visit       Cardiovascular and Mediastinum   HTN (hypertension)    Chronic, stable.  Continue  furosemide 20 mg daily. Refill sent to the pharmacy. Follow-up in 3 months.       Relevant Medications   furosemide (LASIX) 20 MG tablet     Other   Acute cough - Primary    Cough has improved a few days after starting the arnuity elipta inhaler. Will have her stop this and reach out if her cough returns. Chest x-ray showed no acute disease, but a possible lung nodule. CT results are still pending.        Return in about 3 months (around 10/29/2023) for CPE.    Gerre Scull, NP

## 2023-07-30 NOTE — Telephone Encounter (Signed)
07/30/23 - Pt called saying she was returning the call from CMA. She wants a call back at [phone]

## 2023-07-30 NOTE — Patient Instructions (Signed)
Kayla Alvarez , Thank you for taking time to come for your Medicare Wellness Visit. I appreciate your ongoing commitment to your health goals. Please review the following plan we discussed and let me know if I can assist you in the future.   Referrals/Orders/Follow-Ups/Clinician Recommendations: none  This is a list of the screening recommended for you and due dates:  Health Maintenance  Topic Date Due   Hemoglobin A1C  08/07/2023   Eye exam for diabetics  08/11/2023   Complete foot exam   02/05/2024   Medicare Annual Wellness Visit  07/29/2024   DEXA scan (bone density measurement)  Completed   HPV Vaccine  Aged Out   DTaP/Tdap/Td vaccine  Discontinued   Pneumonia Vaccine  Discontinued   Flu Shot  Discontinued   COVID-19 Vaccine  Discontinued   Zoster (Shingles) Vaccine  Discontinued    Advanced directives: (Declined) Advance directive discussed with you today. Even though you declined this today, please call our office should you change your mind, and we can give you the proper paperwork for you to fill out.  Next Medicare Annual Wellness Visit scheduled for next year: Yes  Insert Preventive Care attachment Insert FALL PREVENTION attachment if needed

## 2023-07-31 NOTE — Progress Notes (Signed)
Care Coordination   Note   07/31/2023 Name: [name] MRN: [id number] DOB: June 19, 1934  Kayla Alvarez is a 89 y.o. year old female who sees McElwee, Lauren A, NP for primary care. I reached out to Kayla Alvarez by phone today to offer care coordination services.  [name] was given information about Care Coordination services today including:   The Care Coordination services include support from the care team which includes your Nurse Coordinator, Clinical Social Worker, or Pharmacist.  The Care Coordination team is here to help remove barriers to the health concerns and goals most important to you. Care Coordination services are voluntary, and the patient may decline or stop services at any time by request to their care team member.   Care Coordination Consent Status: Patient agreed to services and verbal consent obtained.   Follow up plan:  Telephone appointment with care coordination team member scheduled for:  08/09/2023  Encounter Outcome:  Patient Scheduled from referral   Burman Nieves, [facility] Care Coordination Care Guide Direct Dial: [phone]

## 2023-08-09 ENCOUNTER — Ambulatory Visit: Payer: Self-pay | Admitting: Licensed Clinical Social Worker

## 2023-08-09 NOTE — Patient Instructions (Signed)
Visit Information  Thank you for taking time to visit with me today. Please don't hesitate to contact me if I can be of assistance to you.   Following are the goals we discussed today:   Goals Addressed             This Visit's Progress    Patient Stated       Care Coordination Interventions: The patient is interested in the [facility] free hearing aide program and was willing to take the contact information provided by the SW to call and apply for the free hearing aide. The Patient did not have any other SDOH needs at this time The Sw provided the patient the telephone number to the [facility] free hearing aide program  Sw discussed with the patinet to call the SW back if the patient has any barriers with contacting the [facility] program         Our next appointment is by telephone on 08/16/2023 at 3:30pm  Please call the care guide team at [phone] if you need to cancel or reschedule your appointment.   If you are experiencing a Mental Health or Behavioral Health Crisis or need someone to talk to, please call the Suicide and Crisis Lifeline: 988 go to [facility] [address], [place] [phone]) call 911  The patient verbalized understanding of instructions, educational materials, and care plan provided today and DECLINED offer to receive copy of patient instructions, educational materials, and care plan.   Jeanie Cooks, PhD [facility], [facility] Social Worker Direct Dial: [phone]  Fax: [phone]

## 2023-08-09 NOTE — Patient Outreach (Signed)
Care Coordination   Initial Visit Note   08/09/2023 Name: Kayla Alvarez MRN: [id number] DOB: [birth year]  Kayla Alvarez is a 89 y.o. year old female who sees McElwee, Lauren A, NP for primary care. I spoke with  Kayla Alvarez by phone today.  What matters to the patients health and wellness today?  To discuss with with getting assistance with getting a replacement or new hearing aide    Goals Addressed             This Visit's Progress    Patient Stated       Care Coordination Interventions: The patient is interested in the [facility] free hearing aide program and was willing to take the contact information provided by the SW to call and apply for the free hearing aide. The Patient did not have any other SDOH needs at this time The Sw provided the patient the telephone number to the [facility] free hearing aide program  Sw discussed with the patinet to call the SW back if the patient has any barriers with contacting the [facility] program         SDOH assessments and interventions completed:  Yes  SDOH Interventions Today    Flowsheet Row Most Recent Value  SDOH Interventions   Food Insecurity Interventions Intervention Not Indicated  [Patient does receive food stamps]  Transportation Interventions Intervention Not Indicated  [Patient has her own transportation]  Financial Strain Interventions Other (Comment)  [Sw referred tha Pt to the [facility] for the free hearing aide program and the patient stated that she will call to complete the program]        Care Coordination Interventions:  Yes, provided  Interventions Today    Flowsheet Row Most Recent Value  General Interventions   General Interventions Discussed/Reviewed General Interventions Discussed, Community Resources  [SW referred the patient to the [facility] free hearing aide program]  Education Interventions   Education Provided Provided Education  [SW provided the contact informtiaon for the [facility] free hearing aide program]         Follow up plan: Follow up call scheduled for 08/16/2023 at 3:00pm    Encounter Outcome:  Patient Visit Completed   Jeanie Cooks, PhD [facility], [facility] Social Worker Direct Dial: [phone]  Fax: [phone]

## 2023-08-14 ENCOUNTER — Ambulatory Visit: Payer: Self-pay | Admitting: Licensed Clinical Social Worker

## 2023-08-14 NOTE — Patient Outreach (Signed)
Care Coordination   Follow Up Visit Note   08/14/2023 Name: MALIA BEM MRN: [id number] DOB: 05-19-1934  [name] is a 89 y.o. year old female who sees McElwee, Lauren A, NP for primary care. I spoke with  [name] by phone today.  What matters to the patients health and wellness today?  Sw needed to follow up with the patient regarding the appointment tiw    Goals Addressed             This Visit's Progress    Patient Stated       Care Coordination Interventions: The patient contacted the [facility] for the free hearing aid and now has an appointment on 08/30/2023 with [facility] for a Hearing screening. The SW will follow up with the patient after that date.         SDOH assessments and interventions completed:  No Interventions Today    Flowsheet Row Most Recent Value  General Interventions   General Interventions Discussed/Reviewed General Interventions Discussed  [Patient has a hearing test sheduled on 08/30/2023 and the will follow up]           Care Coordination Interventions:  Yes, provided  Interventions Today    Flowsheet Row Most Recent Value  General Interventions   General Interventions Discussed/Reviewed General Interventions Discussed  [Patient has a hearing test sheduled on 08/30/2023 and the will follow up]        Follow up plan: Follow up call scheduled for 09/03/2023 at 9:30am    Encounter Outcome:  Patient Visit Completed   Jeanie Cooks, PhD [facility], [facility] Social Worker Direct Dial: [phone]  Fax: [phone]

## 2023-08-14 NOTE — Patient Instructions (Signed)
Visit Information  Thank you for taking time to visit with me today. Please don't hesitate to contact me if I can be of assistance to you.   Following are the goals we discussed today:   Goals Addressed             This Visit's Progress    Patient Stated       Care Coordination Interventions: The patient contacted the [facility] for the free hearing aid and now has an appointment on 08/30/2023 with [facility] for a Hearing screening. The SW will follow up with the patient after that date.         Our next appointment is by telephone on 09/03/2023 at 9:30 am  Please call the care guide team at [phone] if you need to cancel or reschedule your appointment.   If you are experiencing a Mental Health or Behavioral Health Crisis or need someone to talk to, please call the Suicide and Crisis Lifeline: 988 go to [facility] Urgent [facility] [address] [place] [phone]) call 911  Social Worker Will follow up with the patient      Jeanie Cooks, PhD [facility], [facility] Social Worker Direct Dial: [phone]  Fax: [phone]

## 2023-08-16 ENCOUNTER — Encounter: Payer: Medicare HMO | Admitting: Licensed Clinical Social Worker

## 2023-08-23 DIAGNOSIS — H401131 Primary open-angle glaucoma, bilateral, mild stage: Secondary | ICD-10-CM | POA: Diagnosis not present

## 2023-08-30 ENCOUNTER — Telehealth: Payer: Self-pay | Admitting: Licensed Clinical Social Worker

## 2023-08-30 NOTE — Patient Outreach (Signed)
Care Coordination   Call from [facility]  Visit Note   08/30/2023 Name: Kayla Alvarez MRN: [id number] DOB: 04-29-1934  [name] is a 89 y.o. year old female who sees McElwee, Lauren A, NP for primary care. I  spoke to a representative form [facility] Ulice Brilliant   What matters to the patients health and wellness today?  The patient is being seen today for her hearing screning and will then take to [facility]    Goals Addressed   None     SDOH assessments and interventions completed:  No     Care Coordination Interventions:  No, not indicated     Encounter Outcome:  Patient Visit Completed  Jeanie Cooks, PhD [facility], [facility] Social Worker Direct Dial: [phone]  Fax: [phone]

## 2023-09-03 ENCOUNTER — Ambulatory Visit: Payer: Self-pay | Admitting: Licensed Clinical Social Worker

## 2023-09-03 NOTE — Patient Outreach (Signed)
  Care Coordination   Follow Up Visit Note   09/03/2023 Name: Kayla Alvarez MRN: [id number] DOB: 1934-04-26  Kayla Alvarez is a 89 y.o. year old female who sees McElwee, Lauren A, NP for primary care. I spoke with  Kayla Alvarez by phone today.  What matters to the patients health and wellness today?  Follow up on hearing aide screening results, patient had a screening through [facility] and the results will be mailed to the [facility] SW for approval for a new hearing aide.   Goals Addressed             This Visit's Progress    COMPLETED: Patient Stated       Care Coordination Interventions: The patient contacted the [facility] for the free hearing aid and now has an appointment on 08/30/2023 with [facility] for a Hearing screening. The SW will follow up with the patient after that date.         SDOH assessments and interventions completed:  Yes  SDOH Interventions Today    Flowsheet Row Most Recent Value  SDOH Interventions   Housing Interventions Intervention Not Indicated  Utilities Interventions Intervention Not Indicated        Care Coordination Interventions:  Yes, provided  Interventions Today    Flowsheet Row Most Recent Value  General Interventions   General Interventions Discussed/Reviewed General Interventions Discussed, General Interventions Reviewed, Durable Medical Equipment (DME)  [Patient had a hearing screening and will be sending results to the [facility] SW [place] Client for approaval for a free hearing aide]        Follow up plan: No further intervention required.   Encounter Outcome:  Patient Visit Completed   Jeanie Cooks, PhD [facility], [facility] Social Worker Direct Dial: [phone]  Fax: [phone]

## 2023-09-03 NOTE — Patient Instructions (Signed)
Visit Information  Thank you for taking time to visit with me today. Please don't hesitate to contact me if I can be of assistance to you.   Following are the goals we discussed today:   Goals Addressed             This Visit's Progress    COMPLETED: Patient Stated       Care Coordination Interventions: The patient contacted the [facility] for the free hearing aid and now has an appointment on 08/30/2023 with [facility] for a Hearing screening. The SW will follow up with the patient after that date.          Please call the care guide team at [phone] if you need to cancel or reschedule your appointment.   If you are experiencing a Mental Health or Behavioral Health Crisis or need someone to talk to, please call the Suicide and Crisis Lifeline: 988 go to [facility] [address], [place] [phone]) call 911  The patient verbalized understanding of instructions, educational materials, and care plan provided today and DECLINED offer to receive copy of patient instructions, educational materials, and care plan.   Jeanie Cooks, PhD [facility], [facility] Social Worker Direct Dial: [phone]  Fax: [phone]

## 2023-12-14 ENCOUNTER — Other Ambulatory Visit: Payer: Self-pay | Admitting: Nurse Practitioner

## 2023-12-14 NOTE — Telephone Encounter (Signed)
Lvmtcb to schedule f/up with Lauren in April.

## 2023-12-26 DIAGNOSIS — H401131 Primary open-angle glaucoma, bilateral, mild stage: Secondary | ICD-10-CM | POA: Diagnosis not present

## 2024-01-24 ENCOUNTER — Encounter: Payer: Self-pay | Admitting: Nurse Practitioner

## 2024-01-24 ENCOUNTER — Ambulatory Visit (INDEPENDENT_AMBULATORY_CARE_PROVIDER_SITE_OTHER): Payer: Medicare HMO | Admitting: Nurse Practitioner

## 2024-01-24 VITALS — BP 124/80 | HR 66 | Temp 97.9°F | Ht 60.0 in | Wt 155.2 lb

## 2024-01-24 DIAGNOSIS — R911 Solitary pulmonary nodule: Secondary | ICD-10-CM | POA: Diagnosis not present

## 2024-01-24 DIAGNOSIS — E119 Type 2 diabetes mellitus without complications: Secondary | ICD-10-CM | POA: Diagnosis not present

## 2024-01-24 DIAGNOSIS — E785 Hyperlipidemia, unspecified: Secondary | ICD-10-CM | POA: Diagnosis not present

## 2024-01-24 DIAGNOSIS — I10 Essential (primary) hypertension: Secondary | ICD-10-CM

## 2024-01-24 DIAGNOSIS — E559 Vitamin D deficiency, unspecified: Secondary | ICD-10-CM | POA: Diagnosis not present

## 2024-01-24 DIAGNOSIS — R252 Cramp and spasm: Secondary | ICD-10-CM | POA: Diagnosis not present

## 2024-01-24 DIAGNOSIS — E538 Deficiency of other specified B group vitamins: Secondary | ICD-10-CM

## 2024-01-24 LAB — COMPREHENSIVE METABOLIC PANEL WITH GFR
ALT: 14 U/L (ref 0–35)
AST: 14 U/L (ref 0–37)
Albumin: 4.1 g/dL (ref 3.5–5.2)
Alkaline Phosphatase: 59 U/L (ref 39–117)
BUN: 12 mg/dL (ref 6–23)
CO2: 32 meq/L (ref 19–32)
Calcium: 9.4 mg/dL (ref 8.4–10.5)
Chloride: 100 meq/L (ref 96–112)
Creatinine, Ser: 0.78 mg/dL (ref 0.40–1.20)
GFR: 68.36 mL/min (ref >60.00)
Glucose, Bld: 114 mg/dL — ABNORMAL HIGH (ref 70–99)
Potassium: 4.2 meq/L (ref 3.5–5.1)
Sodium: 137 meq/L (ref 135–145)
Total Bilirubin: 0.5 mg/dL (ref 0.2–1.2)
Total Protein: 6.9 g/dL (ref 6.0–8.3)

## 2024-01-24 LAB — LIPID PANEL
Cholesterol: 173 mg/dL (ref 0–200)
HDL: 56.6 mg/dL (ref >39.00)
LDL Cholesterol: 104 mg/dL — ABNORMAL HIGH (ref 0–99)
NonHDL: 116.81
Total CHOL/HDL Ratio: 3
Triglycerides: 62 mg/dL (ref 0.0–149.0)
VLDL: 12.4 mg/dL (ref 0.0–40.0)

## 2024-01-24 LAB — CBC WITH DIFFERENTIAL/PLATELET
Basophils Absolute: 0.1 10*3/uL (ref 0.0–0.1)
Basophils Relative: 0.9 % (ref 0.0–3.0)
Eosinophils Absolute: 0.2 10*3/uL (ref 0.0–0.7)
Eosinophils Relative: 2 % (ref 0.0–5.0)
HCT: 43.9 % (ref 36.0–46.0)
Hemoglobin: 14.3 g/dL (ref 12.0–15.0)
Lymphocytes Relative: 30.6 % (ref 12.0–46.0)
Lymphs Abs: 2.3 10*3/uL (ref 0.7–4.0)
MCHC: 32.5 g/dL (ref 30.0–36.0)
MCV: 90.9 fl (ref 78.0–100.0)
Monocytes Absolute: 0.7 10*3/uL (ref 0.1–1.0)
Monocytes Relative: 9.9 % (ref 3.0–12.0)
Neutro Abs: 4.3 10*3/uL (ref 1.4–7.7)
Neutrophils Relative %: 56.6 % (ref 43.0–77.0)
Platelets: 268 10*3/uL (ref 150.0–400.0)
RBC: 4.83 Mil/uL (ref 3.87–5.11)
RDW: 13.8 % (ref 11.5–15.5)
WBC: 7.6 10*3/uL (ref 4.0–10.5)

## 2024-01-24 LAB — MICROALBUMIN / CREATININE URINE RATIO
Creatinine,U: 31.9 mg/dL
Microalb Creat Ratio: UNDETERMINED mg/g (ref 0.0–30.0)
Microalb, Ur: <0.7 mg/dL

## 2024-01-24 NOTE — Progress Notes (Unsigned)
   Established Patient Office Visit  Subjective   Patient ID: Kayla Alvarez, female    DOB: 1934/02/18  Age: 89 y.o. MRN: [id number]  Chief Complaint  Patient presents with   Hypertension    Follow up, no concerns    HPI  {History (Optional):23778}  ROS    Objective:     BP 124/80 (BP Location: Left Arm, Patient Position: Sitting, Cuff Size: Normal)   Pulse 66   Temp 97.9 F (36.6 C)   Ht 5' (1.524 m)   Wt 155 lb 3.2 oz (70.4 kg)   SpO2 100%   BMI 30.31 kg/m  {Vitals History (Optional):23777}  Physical Exam   No results found for any visits on 01/24/24.  {Labs (Optional):23779}  The ASCVD Risk score (Arnett DK, et al., 2019) failed to calculate for the following reasons:   The 2019 ASCVD risk score is only valid for ages 49 to 81    Assessment & Plan:   Problem List Items Addressed This Visit       Cardiovascular and Mediastinum   HTN (hypertension) - Primary   Relevant Orders   CBC with Differential/Platelet   Comprehensive metabolic panel with GFR     Respiratory   Lung nodule   Relevant Orders   CT CHEST WO CONTRAST     Endocrine   Diabetes mellitus type 2, diet-controlled (HCC)   Relevant Orders   CBC with Differential/Platelet   Comprehensive metabolic panel with GFR   Hemoglobin A1c   Microalbumin / creatinine urine ratio     Other   Hyperlipidemia   Relevant Orders   CBC with Differential/Platelet   Comprehensive metabolic panel with GFR   Lipid panel   Vitamin D deficiency   Relevant Orders   VITAMIN D 25 Hydroxy (Vit-D Deficiency, Fractures)   Vitamin B12 deficiency   Relevant Orders   Vitamin B12    Return in about 6 months (around 07/26/2024) for CPE.    Gerre Scull, NP

## 2024-01-24 NOTE — Patient Instructions (Signed)
 It was great to see you!  We are checking your labs today and will let you know the results via mychart/phone.   I have ordered a follow-up CT scan on your lung nodule, they will call to schedule.   Let's follow-up in 6 months, sooner if you have concerns.  If a referral was placed today, you will be contacted for an appointment. Please note that routine referrals can sometimes take up to 3-4 weeks to process. Please call our office if you haven't heard anything after this time frame.  Take care,  Rodman Pickle, NP

## 2024-01-25 LAB — HEMOGLOBIN A1C: Hgb A1c MFr Bld: 6.7 % — ABNORMAL HIGH (ref 4.6–6.5)

## 2024-01-25 LAB — VITAMIN B12: Vitamin B-12: 729 pg/mL (ref 211–911)

## 2024-01-25 LAB — VITAMIN D 25 HYDROXY (VIT D DEFICIENCY, FRACTURES): VITD: 34.81 ng/mL (ref 30.00–100.00)

## 2024-01-25 NOTE — Assessment & Plan Note (Addendum)
 Chronic, stable.  Continue furosemide 20 mg daily. Refill sent to the pharmacy. Follow-up in 6 months.

## 2024-01-25 NOTE — Assessment & Plan Note (Signed)
 The pulmonary nodule requires follow-up imaging to assess changes. Order a CT chest without contrast to evaluate the nodule.

## 2024-01-25 NOTE — Assessment & Plan Note (Addendum)
 She has been taking a vitamin B12 supplement daily. Check vitamin B12 and adjust regimen based on results.

## 2024-01-25 NOTE — Assessment & Plan Note (Signed)
She has been taking a vitamin D supplement, will check levels today and treat based on result

## 2024-01-25 NOTE — Assessment & Plan Note (Addendum)
 Chronic, stable. Last A1c was well controlled with diet. Will request recent eye exam records. Check CMP, CBC, A1c today.

## 2024-01-25 NOTE — Assessment & Plan Note (Signed)
Chronic, stable.  Continue simvastatin 20 mg daily.  Check CMP, CBC, lipid panel today. Follow-up in 6 months.

## 2024-02-13 ENCOUNTER — Ambulatory Visit
Admission: RE | Admit: 2024-02-13 | Discharge: 2024-02-13 | Disposition: A | Discharge status: Home / Self Care | Source: Ambulatory Visit | Attending: Nurse Practitioner | Admitting: Nurse Practitioner

## 2024-02-13 DIAGNOSIS — R911 Solitary pulmonary nodule: Secondary | ICD-10-CM

## 2024-02-13 DIAGNOSIS — Z09 Encounter for follow-up examination after completed treatment for conditions other than malignant neoplasm: Secondary | ICD-10-CM | POA: Diagnosis not present

## 2024-03-11 ENCOUNTER — Other Ambulatory Visit: Payer: Self-pay | Admitting: Nurse Practitioner

## 2024-03-12 NOTE — Telephone Encounter (Signed)
 Requesting: Simvastatin  20 MG Oral Tablet  Last Visit: 01/24/2024 Next Visit: 08/04/2024 Last Refill: 12/14/2023  Please Advise

## 2024-05-05 DIAGNOSIS — B078 Other viral warts: Secondary | ICD-10-CM | POA: Diagnosis not present

## 2024-05-05 DIAGNOSIS — L814 Other melanin hyperpigmentation: Secondary | ICD-10-CM | POA: Diagnosis not present

## 2024-05-05 DIAGNOSIS — Z85828 Personal history of other malignant neoplasm of skin: Secondary | ICD-10-CM | POA: Diagnosis not present

## 2024-05-05 DIAGNOSIS — D225 Melanocytic nevi of trunk: Secondary | ICD-10-CM | POA: Diagnosis not present

## 2024-05-05 DIAGNOSIS — L821 Other seborrheic keratosis: Secondary | ICD-10-CM | POA: Diagnosis not present

## 2024-05-05 DIAGNOSIS — L718 Other rosacea: Secondary | ICD-10-CM | POA: Diagnosis not present

## 2024-05-05 DIAGNOSIS — D1721 Benign lipomatous neoplasm of skin and subcutaneous tissue of right arm: Secondary | ICD-10-CM | POA: Diagnosis not present

## 2024-05-05 DIAGNOSIS — L720 Epidermal cyst: Secondary | ICD-10-CM | POA: Diagnosis not present

## 2024-05-05 DIAGNOSIS — D1801 Hemangioma of skin and subcutaneous tissue: Secondary | ICD-10-CM | POA: Diagnosis not present

## 2024-06-25 DIAGNOSIS — H35363 Drusen (degenerative) of macula, bilateral: Secondary | ICD-10-CM | POA: Diagnosis not present

## 2024-06-25 DIAGNOSIS — H43393 Other vitreous opacities, bilateral: Secondary | ICD-10-CM | POA: Diagnosis not present

## 2024-06-25 DIAGNOSIS — H401131 Primary open-angle glaucoma, bilateral, mild stage: Secondary | ICD-10-CM | POA: Diagnosis not present

## 2024-06-25 DIAGNOSIS — H02825 Cysts of left lower eyelid: Secondary | ICD-10-CM | POA: Diagnosis not present

## 2024-06-25 DIAGNOSIS — Z961 Presence of intraocular lens: Secondary | ICD-10-CM | POA: Diagnosis not present

## 2024-09-08 ENCOUNTER — Other Ambulatory Visit: Payer: Self-pay | Admitting: Nurse Practitioner

## 2024-09-08 DIAGNOSIS — E785 Hyperlipidemia, unspecified: Secondary | ICD-10-CM

## 2024-09-08 NOTE — Telephone Encounter (Signed)
 Rx refill request for Simvastatin  20mg . LOV 01/24/2024 FOV (Annual Wellness Visit) 10/13/2024 Last refill 03/12/2024. I have pended this medication fro your approval. Wasn't sure if you wanted to see her in an appt before refilling. Last lipid panel was 01/24/2024.

## 2024-10-13 ENCOUNTER — Encounter: Payer: Self-pay | Admitting: Nurse Practitioner

## 2024-10-13 ENCOUNTER — Ambulatory Visit: Admitting: Nurse Practitioner

## 2024-10-13 ENCOUNTER — Ambulatory Visit

## 2024-10-13 VITALS — BP 136/80 | HR 76 | Temp 98.4°F | Ht 59.5 in | Wt 155.4 lb

## 2024-10-13 VITALS — BP 140/60 | HR 86 | Temp 99.2°F | Ht 59.5 in | Wt 157.4 lb

## 2024-10-13 DIAGNOSIS — I10 Essential (primary) hypertension: Secondary | ICD-10-CM | POA: Diagnosis not present

## 2024-10-13 DIAGNOSIS — J4520 Mild intermittent asthma, uncomplicated: Secondary | ICD-10-CM

## 2024-10-13 DIAGNOSIS — E785 Hyperlipidemia, unspecified: Secondary | ICD-10-CM

## 2024-10-13 DIAGNOSIS — E119 Type 2 diabetes mellitus without complications: Secondary | ICD-10-CM | POA: Diagnosis not present

## 2024-10-13 DIAGNOSIS — E538 Deficiency of other specified B group vitamins: Secondary | ICD-10-CM

## 2024-10-13 DIAGNOSIS — R059 Cough, unspecified: Secondary | ICD-10-CM

## 2024-10-13 DIAGNOSIS — M8580 Other specified disorders of bone density and structure, unspecified site: Secondary | ICD-10-CM

## 2024-10-13 DIAGNOSIS — Z0001 Encounter for general adult medical examination with abnormal findings: Secondary | ICD-10-CM | POA: Diagnosis not present

## 2024-10-13 DIAGNOSIS — H409 Unspecified glaucoma: Secondary | ICD-10-CM

## 2024-10-13 DIAGNOSIS — Z780 Asymptomatic menopausal state: Secondary | ICD-10-CM

## 2024-10-13 DIAGNOSIS — E559 Vitamin D deficiency, unspecified: Secondary | ICD-10-CM

## 2024-10-13 DIAGNOSIS — Z0000 Encounter for general adult medical examination without abnormal findings: Secondary | ICD-10-CM

## 2024-10-13 LAB — POC COVID19 BINAXNOW: SARS Coronavirus 2 Ag: NEGATIVE

## 2024-10-13 MED ORDER — ALBUTEROL SULFATE HFA 108 (90 BASE) MCG/ACT IN AERS: 2.0000 | INHALATION_SPRAY | Freq: Four times a day (QID) | RESPIRATORY_TRACT | 1 refills | Status: AC | PRN

## 2024-10-13 MED ORDER — AZITHROMYCIN 250 MG PO TABS: ORAL_TABLET | ORAL | 0 refills | Status: AC

## 2024-10-13 MED ORDER — BENZONATATE 100 MG PO CAPS: 100.0000 mg | ORAL_CAPSULE | Freq: Three times a day (TID) | ORAL | 0 refills | Status: AC | PRN

## 2024-10-13 MED ORDER — FUROSEMIDE 20 MG PO TABS: 20.0000 mg | ORAL_TABLET | Freq: Every day | ORAL | 1 refills | Status: AC

## 2024-10-13 MED ORDER — SIMVASTATIN 20 MG PO TABS: 20.0000 mg | ORAL_TABLET | Freq: Every day | ORAL | 1 refills | Status: AC

## 2024-10-13 NOTE — Patient Instructions (Addendum)
 It was great to see you!  We are checking your labs today and will let you know the results via mychart/phone.   I have refilled your medications   I have refilled your albuterol  inhaler as needed for shortness of breath or wheezing  Start tessalon  perles 3 times a day as needed for cough  Start zpak antibiotic 2 tablets today, then 1 tablet daily until gone   Let's follow-up in 6 months, sooner if you have concerns.  If a referral was placed today, you will be contacted for an appointment. Please note that routine referrals can sometimes take up to 3-4 weeks to process. Please call our office if you haven't heard anything after this time frame.  Take care,  Tinnie Harada, NP

## 2024-10-13 NOTE — Progress Notes (Unsigned)
 BP 136/80 (BP Location: Left Arm, Patient Position: Sitting, Cuff Size: Normal)   Pulse 76   Temp 98.4 F (36.9 C)   Ht 4' 11.5 (1.511 m)   Wt 155 lb 6.4 oz (70.5 kg)   SpO2 96%   BMI 30.86 kg/m    Subjective:    Patient ID: Kayla Alvarez, female    DOB: 1933-12-02, 89 y.o.   MRN: [id number]  CC: Chief Complaint  Patient presents with   Annual Exam    Concerns with cough, sneezing, runny nose    HPI: Kayla Alvarez is a 89 y.o. female presenting on 10/13/2024 for comprehensive medical examination. Current medical complaints include:cough, congestion   She currently lives with: alone Menopausal Symptoms: no  Depression and Anxiety Screen done today and results listed below:     10/13/2024    1:57 PM 10/13/2024    1:06 PM 07/30/2023    1:38 PM 06/28/2023    9:35 AM 07/11/2022    1:30 PM  Depression screen PHQ 2/9  Decreased Interest 0 0 0 1 0  Down, Depressed, Hopeless 0 0 0 0 0  PHQ - 2 Score 0 0 0 1 0  Altered sleeping 0 0 0 0   Tired, decreased energy 0 0 0 1   Change in appetite 0 0 0 0   Feeling bad or failure about yourself  0 0 0 0   Trouble concentrating 0 0 0 0   Moving slowly or fidgety/restless 0 0 0 0   Suicidal thoughts 0 0 0 0   PHQ-9 Score 0 0 0  2    Difficult doing work/chores Not difficult at all Not difficult at all Not difficult at all Not difficult at all      Data saved with a previous flowsheet row definition      10/13/2024    1:57 PM 06/28/2023    9:36 AM  GAD 7 : Generalized Anxiety Score  Nervous, Anxious, on Edge 0 0  Control/stop worrying 1 0  Worry too much - different things 1 0  Trouble relaxing 0 0  Restless 0 0  Easily annoyed or irritable 0 0  Afraid - awful might happen 0 0  Total GAD 7 Score 2 0  Anxiety Difficulty Not difficult at all Not difficult at all    The patient does not have a history of falls. I did not complete a risk assessment for falls. A plan of care for falls was not documented.   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   ELDERBERRY PO Take by mouth.   Fluticasone  Furoate (ARNUITY ELLIPTA ) 100 MCG/ACT AEPB Inhale 1  puff into the lungs daily at 6 (six) AM.   latanoprost (XALATAN) 0.005 % ophthalmic solution SMARTSIG:1 Drop(s) In Eye(s) Every Evening   mirabegron  ER (MYRBETRIQ ) 25 MG TB24 tablet Take 1 tablet (25 mg total) by mouth daily.   vitamin B-12 (CYANOCOBALAMIN ) 1000 MCG tablet Take 1,000 mcg by mouth daily.   [DISCONTINUED] simvastatin  (ZOCOR ) 40 MG tablet Take 1 tablet (40 mg total) by mouth at bedtime. (Patient taking differently: Take 20 mg by mouth at bedtime.)   No current facility-administered medications on file prior to visit.    Allergies:  Allergies[1]  Social History:  Social History   Socioeconomic History   Marital status: Widowed    Spouse name: Not on file   Number of children: Not on file   Years of education: Not on file   Highest education level: Not on file  Occupational History   Not on file  Tobacco Use   Smoking status: Former   Smokeless tobacco: Never  Vaping Use   Vaping status: Never Used  Substance and Sexual Activity   Alcohol use: No   Drug use: No   Sexual activity: Not on file   Other Topics Concern   Not on file  Social History Narrative   Not on file   Social Drivers of Health   Tobacco Use: Medium Risk (10/13/2024)   Patient History    Smoking Tobacco Use: Former    Smokeless Tobacco Use: Never    Passive Exposure: Not on file  Financial Resource Strain: Low Risk (10/13/2024)   Overall Financial Resource Strain (CARDIA)    Difficulty of Paying Living Expenses: Not hard at all  Food Insecurity: No Food Insecurity (10/13/2024)   Epic    Worried About Radiation Protection Practitioner of Food in the Last Year: Never true    Ran Out of Food in the Last Year: Never true  Transportation Needs: No Transportation Needs (08/09/2023)   PRAPARE - Administrator, Civil Service (Medical): No    Lack of Transportation (Non-Medical): No  Physical Activity: Inactive (10/13/2024)   Exercise Vital Sign    Days of Exercise per Week: 0 days    Minutes of Exercise per Session: 0 min  Stress: No Stress Concern Present (10/13/2024)   [employer] of Occupational Health - Occupational Stress Questionnaire    Feeling of Stress: Not at all  Social Connections: Moderately Isolated (10/13/2024)   Social Connection and Isolation Panel    Frequency of Communication with Friends and Family: Three times a week    Frequency of Social Gatherings with Friends and Family: Three times a week    Attends Religious Services: More than 4 times per year    Active Member of Clubs or Organizations: No    Attends Banker Meetings: Never    Marital Status: Widowed  Intimate Partner Violence: Not At Risk (10/13/2024)   Epic    Fear of Current or Ex-Partner: No    Emotionally Abused: No    Physically Abused: No    Sexually Abused: No  Depression (PHQ2-9): Low Risk (10/13/2024)   Depression (PHQ2-9)    PHQ-2 Score: 0  Alcohol Screen: Low Risk (10/13/2024)   Alcohol Screen    Last Alcohol Screening Score (AUDIT): 0  Housing: Unknown (10/13/2024)   Epic    Unable to Pay for  Housing in the Last Year: No    Number of Times Moved in the Last Year: Not on file    Homeless in the Last  Year: No  Utilities: Not At Risk (10/13/2024)   Epic    Threatened with loss of utilities: No  Health Literacy: Adequate Health Literacy (10/13/2024)   B1300 Health Literacy    Frequency of need for help with medical instructions: Never   Tobacco Use History[2] Social History   Substance and Sexual Activity  Alcohol Use No    Family History:  Family History  Problem Relation Age of Onset   Hypertension Mother    Stroke Mother    Heart disease Father    Hypertension Father    Breast cancer Sister        unsure of age   Hypertension Sister     Past medical history, surgical history, medications, allergies, family history and social history reviewed with patient today and changes made to appropriate areas of the chart.   ROS All other ROS negative except what is listed above and in the HPI.      Objective:    BP 136/80 (BP Location: Left Arm, Patient Position: Sitting, Cuff Size: Normal)   Pulse 76   Temp 98.4 F (36.9 C)   Ht 4' 11.5 (1.511 m)   Wt 155 lb 6.4 oz (70.5 kg)   SpO2 96%   BMI 30.86 kg/m   Wt Readings from Last 3 Encounters:  10/13/24 155 lb 6.4 oz (70.5 kg)  10/13/24 157 lb 6.4 oz (71.4 kg)  01/24/24 155 lb 3.2 oz (70.4 kg)    Physical Exam  Results for orders placed or performed in visit on 10/13/24  POC COVID-19   Collection Time: 10/13/24  2:20 PM  Result Value Ref Range   SARS Coronavirus 2 Ag Negative Negative      Assessment & Plan:   Problem List Items Addressed This Visit       Cardiovascular and Mediastinum   HTN (hypertension)   Relevant Medications   simvastatin  (ZOCOR ) 20 MG tablet   furosemide  (LASIX ) 20 MG tablet   Other Relevant Orders   CBC with Differential/Platelet   Comprehensive metabolic panel with GFR     Respiratory   Asthma, chronic   Relevant Medications   albuterol  (VENTOLIN  HFA) 108 (90 Base)  MCG/ACT inhaler     Endocrine   Diabetes mellitus type 2, diet-controlled (HCC)   Relevant Medications   simvastatin  (ZOCOR ) 20 MG tablet   Other Relevant Orders   CBC with Differential/Platelet   Comprehensive metabolic panel with GFR   Hemoglobin A1c     Musculoskeletal and Integument   Osteopenia   Relevant Orders   DG Bone Density     Other   Hyperlipidemia   Relevant Medications   simvastatin  (ZOCOR ) 20 MG tablet   furosemide  (LASIX ) 20 MG tablet   Other Relevant Orders   CBC with Differential/Platelet   Comprehensive metabolic panel with GFR   Lipid panel   Glaucoma of both eyes   Vitamin D  deficiency   Relevant Orders   VITAMIN D  25 Hydroxy (Vit-D Deficiency, Fractures)   Vitamin B12 deficiency   Relevant Orders   Vitamin B12   Other Visit Diagnoses       Encounter for general adult medical examination with abnormal findings    -  Primary     Cough in adult       Relevant Orders   POC COVID-19 (Completed)     Postmenopausal estrogen deficiency       Relevant Orders   DG Bone Density        Follow up  plan: Return in about 6 months (around 04/13/2025) for Diabetes.   LABORATORY TESTING:  - Pap smear: not applicable  IMMUNIZATIONS:   - Tdap: Tetanus vaccination status reviewed: Declined. - Influenza: Declined - Pneumovax: Declined - Prevnar: Declined - HPV: Not applicable - Shingrix vaccine: Declined  SCREENING: -Mammogram: Ordered today  - Colonoscopy: Not applicable  - Bone Density: Ordered today   PATIENT COUNSELING:   Advised to take 1 mg of folate supplement per day if capable of pregnancy.   Sexuality: Discussed sexually transmitted diseases, partner selection, use of condoms, avoidance of unintended pregnancy  and contraceptive alternatives.   Advised to avoid cigarette smoking.  I discussed with the patient that most people either abstain from alcohol or drink within safe limits (<=14/week and <=4 drinks/occasion for males, <=7/weeks  and <= 3 drinks/occasion for females) and that the risk for alcohol disorders and other health effects rises proportionally with the number of drinks per week and how often a drinker exceeds daily limits.  Discussed cessation/primary prevention of drug use and availability of treatment for abuse.   Diet: Encouraged to adjust caloric intake to maintain  or achieve ideal body weight, to reduce intake of dietary saturated fat and total fat, to limit sodium intake by avoiding high sodium foods and not adding table salt, and to maintain adequate dietary potassium and calcium preferably from fresh fruits, vegetables, and low-fat dairy products.    stressed the importance of regular exercise  Injury prevention: Discussed safety belts, safety helmets, smoke detector, smoking near bedding or upholstery.   Dental health: Discussed importance of regular tooth brushing, flossing, and dental visits.    NEXT PREVENTATIVE PHYSICAL DUE IN 1 YEAR. Return in about 6 months (around 04/13/2025) for Diabetes.  Kayla Alvarez    [1] No Known Allergies [2]  Social History Tobacco Use  Smoking Status Former  Smokeless Tobacco Never

## 2024-10-13 NOTE — Patient Instructions (Addendum)
 Kayla Alvarez,  Thank you for taking the time for your Medicare Wellness Visit. I appreciate your continued commitment to your health goals. Please review the care plan we discussed, and feel free to reach out if I can assist you further.  Please note that Annual Wellness Visits do not include a physical exam. Some assessments may be limited, especially if the visit was conducted virtually. If needed, we may recommend an in-person follow-up with your provider.  Ongoing Care Seeing your primary care provider every 3 to 6 months helps us  monitor your health and provide consistent, personalized care.   Referrals If a referral was made during today's visit and you haven't received any updates within two weeks, please contact the referred provider directly to check on the status.  Recommended Screenings:  Health Maintenance  Topic Date Due   Eye exam for diabetics  08/11/2023   Complete foot exam   02/05/2024   Hemoglobin A1C  07/26/2024   Medicare Annual Wellness Visit  07/29/2024   Flu Shot  01/27/2025*   Osteoporosis screening with Bone Density Scan  Completed   Meningitis B Vaccine  Aged Out   DTaP/Tdap/Td vaccine  Discontinued   Pneumococcal Vaccine for age over 74  Discontinued   COVID-19 Vaccine  Discontinued   Zoster (Shingles) Vaccine  Discontinued  *Topic was postponed. The date shown is not the original due date.       10/13/2024    1:10 PM  Advanced Directives  Does Patient Have a Medical Advance Directive? No  Would patient like information on creating a medical advance directive? No - Patient declined    Vision: Annual vision screenings are recommended for early detection of glaucoma, cataracts, and diabetic retinopathy. These exams can also reveal signs of chronic conditions such as diabetes and high blood pressure.  Dental: Annual dental screenings help detect early signs of oral cancer, gum disease, and other conditions linked to overall health, including heart disease  and diabetes.  Please see the attached documents for additional preventive care recommendations.

## 2024-10-13 NOTE — Progress Notes (Signed)
 Chief Complaint  Patient presents with   Medicare Wellness     Subjective:   Kayla Alvarez is a 89 y.o. female who presents for a Medicare Annual Wellness Visit.  Visit info / Clinical Intake: Medicare Wellness Visit Type:: Subsequent Annual Wellness Visit Persons participating in visit and providing information:: patient Medicare Wellness Visit Mode:: In-person (required for WTM) Interpreter Needed?: No Pre-visit prep was completed: yes AWV questionnaire completed by patient prior to visit?: no Living arrangements:: (!) lives alone Patient's Overall Health Status Rating: good Typical amount of pain: none Does pain affect daily life?: no Are you currently prescribed opioids?: no  Dietary Habits and Nutritional Risks How many meals a day?: 2 Eats fruit and vegetables daily?: yes Most meals are obtained by: preparing own meals In the last 2 weeks, have you had any of the following?: none Diabetic:: (!) yes Any non-healing wounds?: no How often do you check your BS?: 0 Would you like to be referred to a Nutritionist or for Diabetic Management? : no  Functional Status Activities of Daily Living (to include ambulation/medication): Independent Ambulation: Independent Medication Administration: Independent Home Management (perform basic housework or laundry): Independent Manage your own finances?: yes Primary transportation is: driving Concerns about vision?: no *vision screening is required for WTM* Concerns about hearing?: (!) yes Uses hearing aids?: (!) yes  Fall Screening Falls in the past year?: 0 Number of falls in past year: 0 Was there an injury with Fall?: 0 Fall Risk Category Calculator: 0 Patient Fall Risk Level: Low Fall Risk  Fall Risk Patient at Risk for Falls Due to: Medication side effect Fall risk Follow up: Falls prevention discussed; Falls evaluation completed  Home and Transportation Safety: All rugs have non-skid backing?: N/A, no rugs All  stairs or steps have railings?: yes Grab bars in the bathtub or shower?: (!) no Have non-skid surface in bathtub or shower?: yes Good home lighting?: yes Regular seat belt use?: yes Hospital stays in the last year:: no  Cognitive Assessment Difficulty concentrating, remembering, or making decisions? : yes Will 6CIT or Mini Cog be Completed: yes What year is it?: 0 points What month is it?: 0 points Give patient an address phrase to remember (5 components): [address] About what time is it?: 0 points Count backwards from 20 to 1: 0 points Say the months of the year in reverse: 0 points Repeat the address phrase from earlier: 2 points 6 CIT Score: 2 points  Advance Directives (For Healthcare) Does Patient Have a Medical Advance Directive?: No Would patient like information on creating a medical advance directive?: No - Patient declined  Reviewed/Updated  Reviewed/Updated: Reviewed All (Medical, Surgical, Family, Medications, Allergies, Care Teams, Patient Goals)    Allergies (verified) Patient has no known allergies.   Current Medications (verified) Outpatient Encounter Medications as of 10/13/2024  Medication Sig   albuterol  (VENTOLIN  HFA) 108 (90 Base) MCG/ACT inhaler Inhale 2 puffs into the lungs every 6 (six) hours as needed.   Cholecalciferol (VITAMIN D3 PO) Take by mouth daily.   ELDERBERRY PO Take by mouth.   Fluticasone  Furoate (ARNUITY ELLIPTA ) 100 MCG/ACT AEPB Inhale 1 puff into the lungs daily at 6 (six) AM.   furosemide  (LASIX ) 20 MG tablet Take 1 tablet (20 mg total) by mouth daily.   latanoprost (XALATAN) 0.005 % ophthalmic solution SMARTSIG:1 Drop(s) In Eye(s) Every Evening   mirabegron  ER (MYRBETRIQ ) 25 MG TB24 tablet Take 1 tablet (25 mg total) by mouth daily.  simvastatin  (ZOCOR ) 20 MG tablet TAKE 1 TABLET BY MOUTH AT BEDTIME   vitamin B-12 (CYANOCOBALAMIN ) 1000 MCG tablet Take 1,000 mcg by mouth daily.   [DISCONTINUED] simvastatin  (ZOCOR ) 40 MG  tablet Take 1 tablet (40 mg total) by mouth at bedtime. (Patient taking differently: Take 20 mg by mouth at bedtime.)   No facility-administered encounter medications on file as of 10/13/2024.    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   UTI 09/03/2008   VAGINITIS, ATROPHIC 06/26/2007   Venous insufficiency    Past Surgical History:  Procedure Laterality Date   CESAREAN SECTION     EYE SURGERY     HEMORRHOID SURGERY     TOOTH EXTRACTION     Family History  Problem Relation Age of Onset   Hypertension Mother    Stroke Mother    Heart disease Father    Hypertension Father    Breast cancer Sister        unsure of age   Hypertension Sister    Social History   Occupational History   Not on file  Tobacco Use   Smoking status: Former   Smokeless tobacco: Never  Vaping Use   Vaping status: Never Used  Substance and Sexual Activity   Alcohol use: No   Drug use: No   Sexual activity: Not on file   Tobacco Counseling Counseling given: Not Answered  SDOH Screenings   Food Insecurity: No Food Insecurity (10/13/2024)  Housing: Unknown (10/13/2024)  Transportation Needs: No Transportation Needs (08/09/2023)  Utilities: Not At Risk (10/13/2024)  Alcohol Screen: Low Risk (10/13/2024)  Depression (PHQ2-9): Low Risk (10/13/2024)  Financial Resource Strain: Low Risk (10/13/2024)  Physical Activity: Inactive (10/13/2024)  Social Connections: Moderately Isolated (10/13/2024)  Stress: No Stress Concern Present (10/13/2024)  Tobacco Use: Medium Risk (10/13/2024)  Health Literacy: Adequate Health Literacy (10/13/2024)   See  flowsheets for full screening details  Depression Screen PHQ 2 & 9 Depression Scale- Over the past 2 weeks, how often have you been bothered by any of the following problems? Little interest or pleasure in doing things: 0 Feeling down, depressed, or hopeless (PHQ Adolescent also includes...irritable): 0 PHQ-2 Total Score: 0 Trouble falling or staying asleep, or sleeping too much: 0 Feeling tired or having little energy: 0 Poor appetite or overeating (PHQ Adolescent also includes...weight loss): 0 Feeling bad about yourself - or that you are a failure or have let yourself or your family down: 0 Trouble concentrating on things, such as reading the newspaper or watching television (PHQ Adolescent also includes...like school work): 0 Moving or speaking so slowly that other people could have noticed. Or the opposite - being so fidgety or restless that you have been moving around a lot more than usual: 0 Thoughts that you would be better off dead, or of hurting yourself in some way: 0 PHQ-9 Total Score: 0 If you checked off any problems, how difficult have these problems made it for you to do your work, take care of things at home, or get along with other people?: Not difficult at all  Depression Treatment Depression Interventions/Treatment : EYV7-0 Score <4 Follow-up Not  Indicated     Goals Addressed             This Visit's Progress    Patient Stated       10/13/2024, stay healthy             Objective:    Today's Vitals   10/13/24 1303  BP: (!) 140/60  Pulse: 86  Temp: 99.2 F (37.3 C)  TempSrc: Oral  SpO2: 92%  Weight: 157 lb 6.4 oz (71.4 kg)  Height: 4' 11.5 (1.511 m)   Body mass index is 31.26 kg/m.  Hearing/Vision screen Hearing Screening - Comments:: Has a hearing aid Vision Screening - Comments:: Regular eye exams, [place] Immunizations and Health Maintenance Health Maintenance  Topic Date Due   OPHTHALMOLOGY EXAM  08/11/2023   FOOT EXAM  02/05/2024    HEMOGLOBIN A1C  07/26/2024   Medicare Annual Wellness (AWV)  07/29/2024   Influenza Vaccine  01/27/2025 (Originally 05/30/2024)   Bone Density Scan  Completed   Meningococcal B Vaccine  Aged Out   DTaP/Tdap/Td  Discontinued   Pneumococcal Vaccine: 50+ Years  Discontinued   COVID-19 Vaccine  Discontinued   Zoster Vaccines- Shingrix  Discontinued        Assessment/Plan:  This is a routine wellness examination for Kairi.  Patient Care Team: Nedra Tinnie LABOR, NP as PCP - General (Internal Medicine) Cary Doffing, MD as Consulting Physician (Dermatology) [facility]  I have personally reviewed and noted the following in the patients chart:   Medical and social history Use of alcohol, tobacco or illicit drugs  Current medications and supplements including opioid prescriptions. Functional ability and status Nutritional status Physical activity Advanced directives List of other physicians Hospitalizations, surgeries, and ER visits in previous 12 months Vitals Screenings to include cognitive, depression, and falls Referrals and appointments  No orders of the defined types were placed in this encounter.  In addition, I have reviewed and discussed with patient certain preventive protocols, quality metrics, and best practice recommendations. A written personalized care plan for preventive services as well as general preventive health recommendations were provided to patient.   Ardella FORBES Dawn, LPN   [id number]/[id number]   No follow-ups on file.  After Visit Summary: (In Person-Printed) AVS printed and given to the patient  Nurse Notes: Vaccines not given: all vaccines declined today HM Addressed: Diabetic Foot Exam recommended Requesting eye exam from [facility]

## 2024-10-14 ENCOUNTER — Ambulatory Visit: Payer: Self-pay | Admitting: Nurse Practitioner

## 2024-10-14 LAB — CBC WITH DIFFERENTIAL/PLATELET
Basophils Absolute: 0.1 K/uL (ref 0.0–0.1)
Basophils Relative: 1 % (ref 0.0–3.0)
Eosinophils Absolute: 0.1 K/uL (ref 0.0–0.7)
Eosinophils Relative: 1.5 % (ref 0.0–5.0)
HCT: 40.3 % (ref 36.0–46.0)
Hemoglobin: 13.7 g/dL (ref 12.0–15.0)
Lymphocytes Relative: 23.6 % (ref 12.0–46.0)
Lymphs Abs: 2.2 K/uL (ref 0.7–4.0)
MCHC: 33.9 g/dL (ref 30.0–36.0)
MCV: 90.5 fl (ref 78.0–100.0)
Monocytes Absolute: 1.1 K/uL — ABNORMAL HIGH (ref 0.1–1.0)
Monocytes Relative: 11.9 % (ref 3.0–12.0)
Neutro Abs: 5.8 K/uL (ref 1.4–7.7)
Neutrophils Relative %: 62 % (ref 43.0–77.0)
Platelets: 217 K/uL (ref 150.0–400.0)
RBC: 4.45 Mil/uL (ref 3.87–5.11)
RDW: 14.1 % (ref 11.5–15.5)
WBC: 9.4 K/uL (ref 4.0–10.5)

## 2024-10-14 LAB — LIPID PANEL
Cholesterol: 165 mg/dL (ref 28–200)
HDL: 65.5 mg/dL (ref >39.00)
LDL Cholesterol: 90 mg/dL (ref 0–99)
NonHDL: 99.93
Total CHOL/HDL Ratio: 3
Triglycerides: 52 mg/dL (ref 0.0–149.0)
VLDL: 10.4 mg/dL (ref 0.0–40.0)

## 2024-10-14 LAB — COMPREHENSIVE METABOLIC PANEL WITH GFR
ALT: 15 U/L (ref 0–35)
AST: 18 U/L (ref 5–37)
Albumin: 4 g/dL (ref 3.5–5.2)
Alkaline Phosphatase: 62 U/L (ref 39–117)
BUN: 9 mg/dL (ref 6–23)
CO2: 29 meq/L (ref 19–32)
Calcium: 9.7 mg/dL (ref 8.4–10.5)
Chloride: 103 meq/L (ref 96–112)
Creatinine, Ser: 0.73 mg/dL (ref 0.40–1.20)
GFR: 73.64 mL/min (ref >60.00)
Glucose, Bld: 97 mg/dL (ref 70–99)
Potassium: 4 meq/L (ref 3.5–5.1)
Sodium: 139 meq/L (ref 135–145)
Total Bilirubin: 0.5 mg/dL (ref 0.2–1.2)
Total Protein: 6.6 g/dL (ref 6.0–8.3)

## 2024-10-14 LAB — VITAMIN B12: Vitamin B-12: 945 pg/mL — ABNORMAL HIGH (ref 211–911)

## 2024-10-14 LAB — HEMOGLOBIN A1C: Hgb A1c MFr Bld: 6.2 % (ref 4.6–6.5)

## 2024-10-14 LAB — VITAMIN D 25 HYDROXY (VIT D DEFICIENCY, FRACTURES): VITD: 28.73 ng/mL — ABNORMAL LOW (ref 30.00–100.00)

## 2024-10-14 NOTE — Assessment & Plan Note (Signed)
 Last and only DEXA showed osteopenia in 2001. Discussed repeat DEXA scan, ordered today. Check vitamin D .

## 2024-10-14 NOTE — Assessment & Plan Note (Signed)
Chronic, stable.  Continue simvastatin 20 mg daily.  Check CMP, CBC, lipid panel today. Follow-up in 6 months.

## 2024-10-14 NOTE — Assessment & Plan Note (Signed)
She has been taking a vitamin D supplement, will check levels today and treat based on result

## 2024-10-14 NOTE — Assessment & Plan Note (Signed)
 She has been taking a vitamin B12 supplement daily. Check vitamin B12 and adjust regimen based on results.

## 2024-10-14 NOTE — Assessment & Plan Note (Signed)
Chronic, follows with ophthalmology. She is currently taking eye drops for glaucoma, but doesn't remember the name. Continue collaboration and recommendations.

## 2024-10-14 NOTE — Assessment & Plan Note (Signed)
Chronic, stable.  Continue furosemide 20 mg daily. Check CMP, CBC today. Follow-up in 6 months.

## 2024-10-14 NOTE — Assessment & Plan Note (Signed)
Chronic, stable. Only uses albuterol as needed and uses it rarely. Albuterol refill sent to pharmacy. Follow up in 6 months or sooner with concerns.

## 2024-10-14 NOTE — Assessment & Plan Note (Signed)
 Chronic, stable. Last A1c was well controlled with diet. Check CMP, CBC, A1c today.

## 2024-10-15 ENCOUNTER — Other Ambulatory Visit: Payer: Self-pay

## 2024-10-15 MED ORDER — ARNUITY ELLIPTA 100 MCG/ACT IN AEPB: 1.0000 | INHALATION_SPRAY | Freq: Every day | RESPIRATORY_TRACT | 1 refills | Status: AC

## 2024-10-15 NOTE — Telephone Encounter (Signed)
 Patient notified that Rx sent to pharmacy.  Thanks, BMS/CMA

## 2024-10-15 NOTE — Telephone Encounter (Signed)
 Requesting: Fluticasone  Furoate (ARNUITY ELLIPTA ) 100 MCG/ACT AEPB  Last Visit: 10/13/2024 Next Visit: Visit date not found Last Refill: 06/28/2023  Please Advise

## 2024-11-14 ENCOUNTER — Telehealth: Payer: Self-pay

## 2024-11-14 ENCOUNTER — Other Ambulatory Visit (HOSPITAL_COMMUNITY): Payer: Self-pay

## 2024-11-14 NOTE — Telephone Encounter (Signed)
 Pharmacy Patient Advocate Encounter   Received notification from Physician's Office that prior authorization for ARNUITY ELLIPTA  is required/requested.   Insurance verification completed.   The patient is insured through [employer].   Per test claim: Refill too soon. PA is not needed at this time. Medication was filled 11/13/24. Next eligible fill date is 12/06/24.

## 2024-11-14 NOTE — Telephone Encounter (Signed)
 Noted

## 2024-11-14 NOTE — Telephone Encounter (Signed)
 Can we get a prior auth on Fluticasone  Furoate (ARNUITY ELLIPTA ) 100 MCG/ACT AEPB ?      Received a letter from [facility] and patient needs a prior auth or medication changed.
